# Patient Record
Sex: Female | Born: 1980
Health system: Southern US, Community
[De-identification: ages and names within clinical notes are randomized; demographics above are authoritative.]

## PROBLEM LIST (undated history)

## (undated) DIAGNOSIS — N61 Mastitis without abscess: Secondary | ICD-10-CM

## (undated) DIAGNOSIS — K5190 Ulcerative colitis, unspecified, without complications: Secondary | ICD-10-CM

## (undated) DIAGNOSIS — O039 Complete or unspecified spontaneous abortion without complication: Secondary | ICD-10-CM

## (undated) HISTORY — DX: Mastitis without abscess: N61.0

## (undated) HISTORY — DX: Complete or unspecified spontaneous abortion without complication: O03.9

## (undated) HISTORY — DX: Ulcerative colitis, unspecified, without complications: K51.90

## (undated) HISTORY — PX: NO PAST SURGERIES: SHX2092

---

## 2006-07-06 ENCOUNTER — Ambulatory Visit: Payer: Self-pay | Admitting: Obstetrics and Gynecology

## 2006-09-24 ENCOUNTER — Ambulatory Visit: Payer: Self-pay | Admitting: Obstetrics and Gynecology

## 2006-11-26 ENCOUNTER — Encounter: Payer: Self-pay | Admitting: Maternal & Fetal Medicine

## 2007-01-31 ENCOUNTER — Encounter: Payer: Self-pay | Admitting: Obstetrics and Gynecology

## 2007-02-12 ENCOUNTER — Ambulatory Visit: Payer: Self-pay | Admitting: Obstetrics and Gynecology

## 2007-03-14 ENCOUNTER — Encounter: Payer: Self-pay | Admitting: Maternal & Fetal Medicine

## 2007-04-04 ENCOUNTER — Ambulatory Visit: Payer: Self-pay | Admitting: Obstetrics and Gynecology

## 2007-05-08 ENCOUNTER — Inpatient Hospital Stay: Payer: Self-pay | Admitting: Obstetrics and Gynecology

## 2008-06-02 ENCOUNTER — Ambulatory Visit: Payer: Self-pay | Admitting: Obstetrics and Gynecology

## 2008-08-25 ENCOUNTER — Ambulatory Visit: Payer: Self-pay | Admitting: Obstetrics and Gynecology

## 2008-11-09 ENCOUNTER — Ambulatory Visit: Payer: Self-pay | Admitting: Obstetrics and Gynecology

## 2009-01-05 ENCOUNTER — Other Ambulatory Visit: Payer: Self-pay | Admitting: Obstetrics and Gynecology

## 2009-02-02 ENCOUNTER — Inpatient Hospital Stay: Payer: Self-pay | Admitting: Obstetrics and Gynecology

## 2011-05-23 ENCOUNTER — Other Ambulatory Visit: Payer: Self-pay | Admitting: Unknown Physician Specialty

## 2011-05-29 ENCOUNTER — Ambulatory Visit: Payer: Self-pay | Admitting: Unknown Physician Specialty

## 2011-05-31 LAB — PATHOLOGY REPORT

## 2012-04-18 ENCOUNTER — Ambulatory Visit: Payer: Self-pay | Admitting: Obstetrics and Gynecology

## 2012-04-18 LAB — URINALYSIS, COMPLETE
Bacteria: NONE SEEN
Bilirubin,UR: NEGATIVE
Ketone: NEGATIVE
Leukocyte Esterase: NEGATIVE
Ph: 5 (ref 4.5–8.0)
RBC,UR: NONE SEEN /HPF (ref 0–5)
Specific Gravity: 1.02 (ref 1.003–1.030)
Squamous Epithelial: 1
WBC UR: 1 /HPF (ref 0–5)

## 2012-04-18 LAB — CBC WITH DIFFERENTIAL/PLATELET
Basophil #: 0 x10 3/mm 3
Basophil %: 0.3 %
Eosinophil #: 0.3 x10 3/mm 3
Eosinophil %: 3.6 %
HCT: 36.2 %
HGB: 12.9 g/dL
Lymphocyte %: 25.6 %
Lymphs Abs: 2.2 x10 3/mm 3
MCH: 33.1 pg
MCHC: 35.6 g/dL
MCV: 93 fL
Monocyte #: 0.7 "x10 3/mm "
Monocyte %: 8.1 %
Neutrophil #: 5.2 x10 3/mm 3
Neutrophil %: 62.4 %
Platelet: 206 x10 3/mm 3
RBC: 3.89 X10 6/mm 3
RDW: 11.9 %
WBC: 8.4 x10 3/mm 3

## 2012-11-20 ENCOUNTER — Inpatient Hospital Stay: Payer: Self-pay | Admitting: Obstetrics and Gynecology

## 2012-11-20 LAB — CBC WITH DIFFERENTIAL/PLATELET
Basophil %: 0.2 %
Eosinophil #: 0.1 10*3/uL (ref 0.0–0.7)
HCT: 36.3 % (ref 35.0–47.0)
HGB: 13.1 g/dL (ref 12.0–16.0)
Lymphocyte #: 1.5 10*3/uL (ref 1.0–3.6)
Lymphocyte %: 17.6 %
MCH: 34.3 pg — ABNORMAL HIGH (ref 26.0–34.0)
MCHC: 36 g/dL (ref 32.0–36.0)
Monocyte #: 0.8 x10 3/mm (ref 0.2–0.9)
Monocyte %: 9.5 %
Neutrophil %: 71.6 %
Platelet: 158 10*3/uL (ref 150–440)
RBC: 3.81 10*6/uL (ref 3.80–5.20)
RDW: 12.5 % (ref 11.5–14.5)
WBC: 8.3 10*3/uL (ref 3.6–11.0)

## 2012-11-21 LAB — HEMATOCRIT: HCT: 36.1 % (ref 35.0–47.0)

## 2013-07-03 LAB — HM PAP SMEAR: HM Pap smear: NEGATIVE

## 2014-10-20 NOTE — H&P (Signed)
L&D Evaluation:  History:  HPI 48 yowmf G3P2002 at 40.5 weeks admitted for IOL.   Patient's Medical History Ulcerative Colitis; GBS+   Patient's Surgical History Colonoscopy   Medications Pre Natal Vitamins   Allergies NKDA   Social History none   Family History Non-Contributory   ROS:  ROS All systems were reviewed.  HEENT, CNS, GI, GU, Respiratory, CV, Renal and Musculoskeletal systems were found to be normal.   Exam:  Vital Signs stable   Urine Protein not completed   General no apparent distress   Mental Status clear   Abdomen gravid, non-tender   Estimated Fetal Weight Average for gestational age, 7#10   Back no CVAT   Edema no edema   Pelvic no external lesions, 4/90/+1/Posterior/AROM-Clear   Mebranes AROM   Description clear   FHT normal rate with no decels   Ucx irregular   Skin dry   Lymph no lymphadenopathy   Other O+/ATB-/NR/RI/VI/HIV-/HB-/GBS+   Impression:  Impression TIUP; GBS+; Asdvanced Cervical Dilation   Plan:  Plan antibiotics for GBBS prophylaxis, AROM; Pitocin Augmentation; Epidural prn   Electronic Signatures: Katalin Colledge, Alanda Slim (MD)  (Signed 11-Jun-14 15:41)  Authored: L&D Evaluation   Last Updated: 11-Jun-14 15:41 by Korvin Valentine, Alanda Slim (MD)

## 2015-06-13 NOTE — L&D Delivery Note (Signed)
Delivery Note At 10:34 PM a viable female infant was delivered via Vaginal, Spontaneous Delivery (Presentation: ROA).  APGAR: 8, 9 ; weight 3,970 grams .   Placenta status: delivered spontaneously, intact.  Cord: 3VC with the following complications: None.  Cord pH: None  Anesthesia:  Epidural Episiotomy: None Lacerations: 1st degree Suture Repair: 3.0 vicryl  (to stop a small bleeding vessel) Est. Blood Loss (mL): 350  Mom to postpartum.  Baby to Couplet care / Skin to Skin.  Called to see patient.  Mom pushed to deliver a viable female infant.  The head delivered easily.  The shoulders, however, did not follow immediately with the usual, gentle traction.  The patient was placed into [place] and with gentle traction the anterior (left) shoulder delivered follwed by the right.  The presentation was ultimately compound with the right hand/arm.  The rest of the body delivered without difficulty.  A single nuchal cord noted and reduced at the perineum.  Baby to mom's chest.  Cord clamped and cut after > 1 min delay.  Cord blood obtained.  Placenta delivered spontaneously, intact, with a 3-vessel cord.  First degree perineal laceration repaired with 3-0 Vicryl with a single figure-of-eight stitch.  This was only repaired due to an active bleeder vessel.  All counts correct.  Hemostasis obtained with IV pitocin and fundal massage. EBL 350 mL.  The left arm and hand was moving normally immediately after delivery.  Prentice Docker, MD 05/02/2016 10:58 PM

## 2015-07-13 ENCOUNTER — Ambulatory Visit (INDEPENDENT_AMBULATORY_CARE_PROVIDER_SITE_OTHER): Payer: 59 | Admitting: Obstetrics and Gynecology

## 2015-07-13 ENCOUNTER — Encounter: Payer: Self-pay | Admitting: Obstetrics and Gynecology

## 2015-07-13 VITALS — BP 103/66 | HR 73 | Ht 67.0 in | Wt 122.8 lb

## 2015-07-13 DIAGNOSIS — K5190 Ulcerative colitis, unspecified, without complications: Secondary | ICD-10-CM | POA: Insufficient documentation

## 2015-07-13 DIAGNOSIS — Z0000 Encounter for general adult medical examination without abnormal findings: Secondary | ICD-10-CM | POA: Diagnosis not present

## 2015-07-13 DIAGNOSIS — Z01419 Encounter for gynecological examination (general) (routine) without abnormal findings: Secondary | ICD-10-CM

## 2015-07-13 NOTE — Progress Notes (Signed)
Patient ID: Lindsay Owens, female   DOB: 12/18/1980, 35 y.o.   MRN: [id number] ANNUAL PREVENTATIVE CARE GYN  ENCOUNTER NOTE  Subjective:       Lindsay Owens is a 35 y.o. G46P3003 female here for a routine annual gynecologic exam.  Current complaints: 1.  none    Lindsay Owens is a 35 yo G43P3003 female who presents for a routine annual gynecologic exam. She has no significant complaints at this time. Her periods are regular, occuring every 28 days, lasting 5 days. Her flow is heavier than previously, but this is not bothersome to her. She denies dysmenorrhea. She does admit to having pelvic floor pain with running, however, this is not debilitating. She denies changes in urinary or bowel patterns.   Gynecologic History Patient's last menstrual period was 06/29/2015 (approximate). Contraception: condoms Last Pap: 07/03/2013 neg/neg. Results were: normal Last mammogram: n/a. Results were: n/a  Obstetric History OB History  Gravida Para Term Preterm AB SAB TAB Ectopic Multiple Living  3 3 3       3     # Outcome Date GA Lbr Len/2nd Weight Sex Delivery Anes PTL Lv  3 Term    8 lb 1.8 oz (3.679 kg)  Vag-Spont   Y  2 Term    7 lb 2.1 oz (3.234 kg)  Vag-Spont   Y  1 Term    8 lb 6.4 oz (3.81 kg)  Vag-Spont   Y      Past Medical History  Diagnosis Date  . Acute mastitis of left breast   . Ulcerative colitis ([place])     controlled    Past Surgical History  Procedure Laterality Date  . No past surgeries      Current Outpatient Prescriptions on File Prior to Visit  Medication Sig Dispense Refill  . Condoms - Female MISC by Does not apply route.    . Multiple Vitamins-Minerals (MULTIVITAMIN ADULT PO) Take by mouth.    . Saccharomyces boulardii (PROBIOTIC) 250 MG CAPS Take by mouth.     No current facility-administered medications on file prior to visit.    No Known Allergies  Social History   Social History  . Marital Status: Married    Spouse Name: N/A  . Number of Children: N/A  .  Years of Education: N/A   Occupational History  . Not on file.   Social History Main Topics  . Smoking status: Never Smoker   . Smokeless tobacco: Not on file  . Alcohol Use: Yes     Comment: rare  . Drug Use: No  . Sexual Activity: Yes    Birth Control/ Protection: Condom   Other Topics Concern  . Not on file   Social History Narrative    Family History  Problem Relation Age of Onset  . Breast cancer Maternal Aunt   . Heart disease Maternal Grandmother   . Colon cancer Neg Hx   . Ovarian cancer Neg Hx   . Diabetes Neg Hx     The following portions of the patient's history were reviewed and updated as appropriate: allergies, current medications, past family history, past medical history, past social history, past surgical history and problem list.  Review of Systems Review of Systems  Constitutional: Negative for fever and chills.  Gastrointestinal: Negative for diarrhea and constipation.  Genitourinary: Negative for dysuria, urgency and frequency.  All other systems reviewed and are negative.    Objective:   BP 103/66 mmHg  Pulse 73  Ht 5'  7" (1.702 m)  Wt 122 lb 12.8 oz (55.702 kg)  BMI 19.23 kg/m2  LMP 06/29/2015 (Approximate) CONSTITUTIONAL: Well-developed, well-nourished female in no acute distress.  PSYCHIATRIC: Normal mood and affect. Normal behavior. Normal judgment and thought content. [place]: Alert and oriented to person, place, and time. Normal muscle tone coordination. No cranial nerve deficit noted. HENT:  Normocephalic, atraumatic, External right and left ear normal.  NECK: Normal range of motion, supple, no masses.  Normal thyroid.  CARDIOVASCULAR: Normal heart rate noted, regular rhythm, no murmur. RESPIRATORY: Clear to auscultation bilaterally. Effort and breath sounds normal, no problems with respiration noted. BREASTS: Symmetric in size. No masses, skin changes, nipple drainage, or lymphadenopathy. ABDOMEN: Soft, normal bowel sounds, no  distention noted.  No tenderness, rebound or guarding.  BLADDER: Normal PELVIC:  External Genitalia: Normal  BUS: Normal  Vagina: Normal  Cervix: Normal; no cervical motion tenderness  Uterus: Normal; retroverted, mobile, slightly tender  Adnexa: Normal; Nonpalpable and nontender  RV: External Exam NormaI  LYMPHATIC: No Axillary, Supraclavicular, or Inguinal Adenopathy.    Assessment:   Annual gynecologic examination 35 y.o. Contraception: condoms bmi-19 Problem List Items Addressed This Visit    None      Plan:  Pap: Not needed Mammogram: Not Indicated Stool Guaiac Testing:  Not Indicated Labs: wants every other year Routine preventative health maintenance measures emphasized: Exercise/Diet/Weight control, Tobacco Warnings, Alcohol/Substance use risks, Stress Management, Peer Pressure Issues and Safe Sex  Return to [place], [place]  Loni Muse PA-S Brayton Mars, MD Incomplete Incomplete note  I have seen, interviewed, and examined the patient in conjunction with the [place].A. student and affirm the diagnosis and management plan. Asjia Berrios A. Ally Knodel, MD, FACOG   Note: This dictation was prepared with Dragon dictation along with smaller phrase technology. Any transcriptional errors that result from this process are unintentional.  '

## 2015-09-02 ENCOUNTER — Ambulatory Visit (INDEPENDENT_AMBULATORY_CARE_PROVIDER_SITE_OTHER): Payer: 59 | Admitting: Obstetrics and Gynecology

## 2015-09-02 ENCOUNTER — Encounter: Payer: Self-pay | Admitting: Obstetrics and Gynecology

## 2015-09-02 VITALS — BP 97/54 | HR 47 | Ht 67.0 in | Wt 120.4 lb

## 2015-09-02 DIAGNOSIS — Z331 Pregnant state, incidental: Secondary | ICD-10-CM | POA: Diagnosis not present

## 2015-09-02 DIAGNOSIS — N912 Amenorrhea, unspecified: Secondary | ICD-10-CM

## 2015-09-02 DIAGNOSIS — K5190 Ulcerative colitis, unspecified, without complications: Secondary | ICD-10-CM

## 2015-09-02 DIAGNOSIS — O09529 Supervision of elderly multigravida, unspecified trimester: Secondary | ICD-10-CM | POA: Insufficient documentation

## 2015-09-02 DIAGNOSIS — Z3490 Encounter for supervision of normal pregnancy, unspecified, unspecified trimester: Secondary | ICD-10-CM

## 2015-09-02 DIAGNOSIS — O09523 Supervision of elderly multigravida, third trimester: Secondary | ICD-10-CM | POA: Insufficient documentation

## 2015-09-02 LAB — POCT URINE PREGNANCY: PREG TEST UR: POSITIVE — AB

## 2015-09-02 NOTE — Patient Instructions (Addendum)
1. UPT-positive 2.New OB counseling: The patient has been given an overview regarding routine prenatal care. Recommendations regarding diet, weight gain, and exercise in pregnancy were given. Prenatal testing, optional genetic testing, and ultrasound use in pregnancy were reviewed.  Benefits of Breast Feeding were discussed. The patient is encouraged to consider nursing her baby post partum. 3. Continue prenatal vitamins 4. Patient is to contact us regarding nausea vomiting if she desires Diclegis

## 2015-09-02 NOTE — Progress Notes (Signed)
GYN ENCOUNTER NOTE  Subjective:       Lindsay Owens is a 35 y.o. [phone] female is here for gynecologic evaluation of the following issues:  1. Confirmation of pregnancy.     Prenatal risk factors: 1. Ulcerative colitis, stable 2. Advanced maternal age; not interested in genetic screening   Gynecologic History Patient's last menstrual period was 07/20/2015 (exact date). Contraception: none Last Pap: Normal Last mammogram: NA  Obstetric History OB History  Gravida Para Term Preterm AB SAB TAB Ectopic Multiple Living  4 3 3       3     # Outcome Date GA Lbr Len/2nd Weight Sex Delivery Anes PTL Lv  4 Current           3 Term    8 lb 1.8 oz (3.679 kg)  Vag-Spont   Y  2 Term    7 lb 2.1 oz (3.234 kg)  Vag-Spont   Y  1 Term    8 lb 6.4 oz (3.81 kg)  Vag-Spont   Y      Past Medical History  Diagnosis Date  . Acute mastitis of left breast   . Ulcerative colitis ([place])     controlled    Past Surgical History  Procedure Laterality Date  . No past surgeries      Current Outpatient Prescriptions on File Prior to Visit  Medication Sig Dispense Refill  . Saccharomyces boulardii (PROBIOTIC) 250 MG CAPS Take by mouth.     No current facility-administered medications on file prior to visit.    No Known Allergies  Social History   Social History  . Marital Status: Married    Spouse Name: N/A  . Number of Children: N/A  . Years of Education: N/A   Occupational History  . Not on file.   Social History Main Topics  . Smoking status: Never Smoker   . Smokeless tobacco: Not on file  . Alcohol Use: Yes     Comment: rare  . Drug Use: No  . Sexual Activity: Yes    Birth Control/ Protection: None   Other Topics Concern  . Not on file   Social History Narrative    Family History  Problem Relation Age of Onset  . Breast cancer Maternal Aunt   . Heart disease Maternal Grandmother   . Colon cancer Neg Hx   . Ovarian cancer Neg Hx   . Diabetes Neg Hx     The  following portions of the patient's history were reviewed and updated as appropriate: allergies, current medications, past family history, past medical history, past social history, past surgical history and problem list.  Review of Systems POSITIVE: Mild nausea; mild breast tenderness Review of Systems - General ROS: negative for - chills, fatigue, fever, hot flashes, malaise or night sweats Hematological and Lymphatic ROS: negative for - bleeding problems or swollen lymph nodes Gastrointestinal ROS: negative for - abdominal pain, blood in stools, change in bowel habits and nausea/vomiting Musculoskeletal ROS: negative for - joint pain, muscle pain or muscular weakness Genito-Urinary ROS: negative for - change in menstrual cycle, dysmenorrhea, dyspareunia, dysuria, genital discharge, genital ulcers, hematuria, incontinence, irregular/heavy menses, nocturia or pelvic pain  Objective:   BP 97/54 mmHg  Pulse 47  Ht 5' 7"  (1.702 m)  Wt 120 lb 6.4 oz (54.613 kg)  BMI 18.85 kg/m2  LMP 07/20/2015 (Exact Date) CONSTITUTIONAL: Well-developed, well-nourished female in no acute distress.  Physical exam-deferred   Assessment:   1. Amenorrhea - POCT urine  pregnancy  2. Ulcerative colitis; stable; minimal problems during pregnancy in the past 3. Advanced maternal age 33. Pregnancy; regular cycles  LMP 07/20/2015  EDD 04/25/2016  (working EGD)  EGA 6.2 weeks     Plan:   1. UPT-positive 2.New OB counseling: The patient has been given an overview regarding routine prenatal care. Recommendations regarding diet, weight gain, and exercise in pregnancy were given. Prenatal testing, optional genetic testing, and ultrasound use in pregnancy were reviewed.  Benefits of Breast Feeding were discussed. The patient is encouraged to consider nursing her baby post partum. 3. Continue prenatal vitamins 4. Patient is to contact us regarding nausea vomiting if she desires Diclegis  Brayton Mars,  MD  A total of 15 minutes were spent face-to-face with the patient during this encounter and over half of that time dealt with counseling and coordination of care.  Note: This dictation was prepared with Dragon dictation along with smaller phrase technology. Any transcriptional errors that result from this process are unintentional.

## 2015-09-30 ENCOUNTER — Ambulatory Visit (INDEPENDENT_AMBULATORY_CARE_PROVIDER_SITE_OTHER): Payer: 59 | Admitting: Obstetrics and Gynecology

## 2015-09-30 VITALS — BP 95/53 | HR 73 | Wt 123.2 lb

## 2015-09-30 DIAGNOSIS — Z36 Encounter for antenatal screening of mother: Secondary | ICD-10-CM

## 2015-09-30 DIAGNOSIS — Z369 Encounter for antenatal screening, unspecified: Secondary | ICD-10-CM

## 2015-09-30 DIAGNOSIS — Z1389 Encounter for screening for other disorder: Secondary | ICD-10-CM

## 2015-09-30 DIAGNOSIS — Z331 Pregnant state, incidental: Secondary | ICD-10-CM

## 2015-09-30 DIAGNOSIS — Z113 Encounter for screening for infections with a predominantly sexual mode of transmission: Secondary | ICD-10-CM | POA: Diagnosis not present

## 2015-09-30 DIAGNOSIS — Z3490 Encounter for supervision of normal pregnancy, unspecified, unspecified trimester: Secondary | ICD-10-CM

## 2015-09-30 NOTE — Progress Notes (Signed)
Lindsay Owens presents for NOB nurse interview visit. G-4.  P-3003. Confirmation of pregnancy by Dr. Enzo Bi on 09/02/2015. Pregnancy education material explained and given. No cats in the home. NOB labs ordered.  HIV labs and Drug screen were explained optional and she could opt out of tests and declined drug screen. PNV encouraged. NT declined. Pt. To follow up with provider on 10/14/15  for NOB physical.  All questions answered.    ZIKA EXPOSURE SCREEN:  The patient has not traveled to a [place] endemic area within the past 6 months, nor has she had unprotected sex with a partner who has travelled to a [place] endemic region within the past 6 months. The patient has been advised to notify us if these factors change any time during this current pregnancy, so adequate testing and monitoring can be initiated.

## 2015-09-30 NOTE — Patient Instructions (Signed)
Pregnancy and Zika Virus Disease Zika virus disease, or Zika, is an illness that can spread to people from mosquitoes that carry the virus. It may also spread from person to person through infected body fluids. Zika first occurred in [place], but recently it has spread to new areas. The virus occurs in tropical climates. The location of Zika continues to change. Most people who become infected with Zika virus do not develop serious illness. However, Zika may cause birth defects in an unborn baby whose mother is infected with the virus. It may also increase the risk of miscarriage. WHAT ARE THE SYMPTOMS OF ZIKA VIRUS DISEASE? In many cases, people who have been infected with Zika virus do not develop any symptoms. If symptoms appear, they usually start about a week after the person is infected. Symptoms are usually mild. They may include:  Fever.  Rash.  Red eyes.  Joint pain. HOW DOES ZIKA VIRUS DISEASE SPREAD? The main way that Zika virus spreads is through the bite of a certain type of mosquito. Unlike most types of mosquitos, which bite only at night, the type of mosquito that carries Zika virus bites both at night and during the day. Zika virus can also spread through sexual contact, through a blood transfusion, and from a mother to her baby before or during birth. Once you have had Zika virus disease, it is unlikely that you will get it again. CAN I PASS ZIKA TO MY BABY DURING PREGNANCY? Yes, Zika can pass from a mother to her baby before or during birth. WHAT PROBLEMS CAN ZIKA CAUSE FOR MY BABY? A woman who is infected with Zika virus while pregnant is at risk of having her baby born with a condition in which the brain or head is smaller than expected (microcephaly). Babies who have microcephaly can have developmental delays, seizures, hearing problems, and vision problems. Having Zika virus disease during pregnancy can also increase the risk of miscarriage. HOW CAN ZIKA VIRUS DISEASE BE  PREVENTED? There is no vaccine to prevent Zika. The best way to prevent the disease is to avoid infected mosquitoes and avoid exposure to body fluids that can spread the virus. Avoid any possible exposure to Zika by taking the following precautions. For women and their sex partners:  Avoid traveling to high-risk areas. The locations where Zika is being reported change often. To identify high-risk areas, check the CDC travel website: www.cdc.gov/zika/geo/index.html  If you or your sex partner must travel to a high-risk area, talk with a health care provider before and after traveling.  Take all precautions to avoid mosquito bites if you live in, or travel to, any of the high-risk areas. Insect repellents are safe to use during pregnancy.  Ask your health care provider when it is safe to have sexual contact. For women:  If you are pregnant or trying to become pregnant, avoid sexual contact with persons who may have been exposed to Zika virus, persons who have possible symptoms of Zika, or persons whose history you are unsure about. If you choose to have sexual contact with someone who may have been exposed to Zika virus, use condoms correctly during the entire duration of sexual activity, every time. Do not share sexual devices, as you may be exposed to body fluids.  Ask your health care provider about when it is safe to attempt pregnancy after a possible exposure to Zika virus. WHAT STEPS SHOULD I TAKE TO AVOID MOSQUITO BITES? Take these steps to avoid mosquito bites when you are   in a high-risk area:  Wear loose clothing that covers your arms and legs.  Limit your outdoor activities.  Do not open windows unless they have window screens.  Sleep under mosquito nets.  Use insect repellent. The best insect repellents have:  DEET, picaridin, oil of lemon eucalyptus (OLE), or [id number] in them.  Higher amounts of an active ingredient in them.  Remember that insect repellents are safe to use  during pregnancy.  Do not use OLE on children who are younger than 3 years of age. Do not use insect repellent on babies who are younger than 2 months of age.  Cover your child's stroller with mosquito netting. Make sure the netting fits snugly and that any loose netting does not cover your child's mouth or nose. Do not use a blanket as a mosquito-protection cover.  Do not apply insect repellent underneath clothing.  If you are using sunscreen, apply the sunscreen before applying the insect repellent.  Treat clothing with permethrin. Do not apply permethrin directly to your skin. Follow label directions for safe use.  Get rid of standing water, where mosquitoes may reproduce. Standing water is often found in items such as buckets, bowls, animal food dishes, and flowerpots. When you return from traveling to any high-risk area, continue taking actions to protect yourself against mosquito bites for 3 weeks, even if you show no signs of illness. This will prevent spreading Zika virus to uninfected mosquitoes. WHAT SHOULD I KNOW ABOUT THE SEXUAL TRANSMISSION OF ZIKA? People can spread Zika to their sexual partners during vaginal, anal, or oral sex, or by sharing sexual devices. Many people with Zika do not develop symptoms, so a person could spread the disease without knowing that they are infected. The greatest risk is to women who are pregnant or who may become pregnant. Zika virus can live longer in semen than it can live in blood. Couples can prevent sexual transmission of the virus by:  Using condoms correctly during the entire duration of sexual activity, every time. This includes vaginal, anal, and oral sex.  Not sharing sexual devices. Sharing increases your risk of being exposed to body fluid from another person.  Avoiding all sexual activity until your health care provider says it is safe. SHOULD I BE TESTED FOR ZIKA VIRUS? A sample of your blood can be tested for Zika virus. A pregnant  woman should be tested if she may have been exposed to the virus or if she has symptoms of Zika. She may also have additional tests done during her pregnancy, such ultrasound testing. Talk with your health care provider about which tests are recommended.   This information is not intended to replace advice given to you by your health care provider. Make sure you discuss any questions you have with your health care provider.   Document Released: 02/17/2015 Document Reviewed: 02/10/2015 Elsevier Interactive Patient Education 2016 [employer] Minor Illnesses and Medications in Pregnancy  Cold/Flu:  Sudafed for congestion- Robitussin (plain) for cough- Tylenol for discomfort.  Please follow the directions on the label.  Try not to take any more than needed.  OTC Saline nasal spray and air humidifier or cool-mist  Vaporizer to sooth nasal irritation and to loosen congestion.  It is also important to increase intake of non carbonated fluids, especially if you have a fever.  Constipation:  Colace-2 capsules at bedtime; Metamucil- follow directions on label; Senokot- 1 tablet at bedtime.  Any one of these medications can be used.  It is also   very important to increase fluids and fruits along with regular exercise.  If problem persists please call the office.  Diarrhea:  Kaopectate as directed on the label.  Eat a bland diet and increase fluids.  Avoid highly seasoned foods.  Headache:  Tylenol 1 or 2 tablets every 3-4 hours as needed  Indigestion:  Maalox, Mylanta, Tums or Rolaids- as directed on label.  Also try to eat small meals and avoid fatty, greasy or spicy foods.  Nausea with or without Vomiting:  Nausea in pregnancy is caused by increased levels of hormones in the body which influence the digestive system and cause irritation when stomach acids accumulate.  Symptoms usually subside after 1st trimester of pregnancy.  Try the following:  Keep saltines, graham crackers or dry toast by your bed  to eat upon awakening.  Don't let your stomach get empty.  Try to eat 5-6 small meals per day instead of 3 large ones.  Avoid greasy fatty or highly seasoned foods.   Take OTC Unisom 1 tablet at bed time along with OTC Vitamin B6 25-50 mg 3 times per day.    If nausea continues with vomiting and you are unable to keep down food and fluids you may need a prescription medication.  Please notify your provider.   Sore throat:  Chloraseptic spray, throat lozenges and or plain Tylenol.  Vaginal Yeast Infection:  OTC Monistat for 7 days as directed on label.  If symptoms do not resolve within a week notify provider.  If any of the above problems do not subside with recommended treatment please call the office for further assistance.   Do not take Aspirin, Advil, Motrin or Ibuprofen.  * * OTC= Over the counter Hyperemesis Gravidarum Hyperemesis gravidarum is a severe form of nausea and vomiting that happens during pregnancy. Hyperemesis is worse than morning sickness. It may cause you to have nausea or vomiting all day for many days. It may keep you from eating and drinking enough food and liquids. Hyperemesis usually occurs during the first half (the first 20 weeks) of pregnancy. It often goes away once a woman is in her second half of pregnancy. However, sometimes hyperemesis continues through an entire pregnancy.  CAUSES  The cause of this condition is not completely known but is thought to be related to changes in the body's hormones when pregnant. It could be from the high level of the pregnancy hormone or an increase in estrogen in the body.  SIGNS AND SYMPTOMS   Severe nausea and vomiting.  Nausea that does not go away.  Vomiting that does not allow you to keep any food down.  Weight loss and body fluid loss (dehydration).  Having no desire to eat or not liking food you have previously enjoyed. DIAGNOSIS  Your health care provider will do a physical exam and ask you about your symptoms.  He or she may also order blood tests and urine tests to make sure something else is not causing the problem.  TREATMENT  You may only need medicine to control the problem. If medicines do not control the nausea and vomiting, you will be treated in the hospital to prevent dehydration, increased acid in the blood (acidosis), weight loss, and changes in the electrolytes in your body that may harm the unborn baby (fetus). You may need IV fluids.  HOME CARE INSTRUCTIONS   Only take over-the-counter or prescription medicines as directed by your health care provider.  Try eating a couple of dry crackers or   toast in the morning before getting out of bed.  Avoid foods and smells that upset your stomach.  Avoid fatty and spicy foods.  Eat 5-6 small meals a day.  Do not drink when eating meals. Drink between meals.  For snacks, eat high-protein foods, such as cheese.  Eat or suck on things that have ginger in them. Ginger helps nausea.  Avoid food preparation. The smell of food can spoil your appetite.  Avoid iron pills and iron in your multivitamins until after 3-4 months of being pregnant. However, consult with your health care provider before stopping any prescribed iron pills. SEEK MEDICAL CARE IF:   Your abdominal pain increases.  You have a severe headache.  You have vision problems.  You are losing weight. SEEK IMMEDIATE MEDICAL CARE IF:   You are unable to keep fluids down.  You vomit blood.  You have constant nausea and vomiting.  You have excessive weakness.  You have extreme thirst.  You have dizziness or fainting.  You have a fever or persistent symptoms for more than 2-3 days.  You have a fever and your symptoms suddenly get worse. MAKE SURE YOU:   Understand these instructions.  Will watch your condition.  Will get help right away if you are not doing well or get worse.   This information is not intended to replace advice given to you by your health care  provider. Make sure you discuss any questions you have with your health care provider.   Document Released: 05/29/2005 Document Revised: 03/19/2013 Document Reviewed: 01/08/2013 Elsevier Interactive Patient Education 2016 Elsevier Inc. Commonly Asked Questions During Pregnancy  Cats: A parasite can be excreted in cat feces.  To avoid exposure you need to have another person empty the little box.  If you must empty the litter box you will need to wear gloves.  Wash your hands after handling your cat.  This parasite can also be found in raw or undercooked meat so this should also be avoided.  Colds, Sore Throats, Flu: Please check your medication sheet to see what you can take for symptoms.  If your symptoms are unrelieved by these medications please call the office.  Dental Work: Most any dental work your dentist recommends is permitted.  X-rays should only be taken during the first trimester if absolutely necessary.  Your abdomen should be shielded with a lead apron during all x-rays.  Please notify your provider prior to receiving any x-rays.  Novocaine is fine; gas is not recommended.  If your dentist requires a note from us prior to dental work please call the office and we will provide one for you.  Exercise: Exercise is an important part of staying healthy during your pregnancy.  You may continue most exercises you were accustomed to prior to pregnancy.  Later in your pregnancy you will most likely notice you have difficulty with activities requiring balance like riding a bicycle.  It is important that you listen to your body and avoid activities that put you at a higher risk of falling.  Adequate rest and staying well hydrated are a must!  If you have questions about the safety of specific activities ask your provider.    Exposure to Children with illness: Try to avoid obvious exposure; report any symptoms to us when noted,  If you have chicken pos, red measles or mumps, you should be immune to  these diseases.   Please do not take any vaccines while pregnant unless you have checked with   your OB provider.  Fetal Movement: After 28 weeks we recommend you do "kick counts" twice daily.  Lie or sit down in a calm quiet environment and count your baby movements "kicks".  You should feel your baby at least 10 times per hour.  If you have not felt 10 kicks within the first hour get up, walk around and have something sweet to eat or drink then repeat for an additional hour.  If count remains less than 10 per hour notify your provider.  Fumigating: Follow your pest control agent's advice as to how long to stay out of your home.  Ventilate the area well before re-entering.  Hemorrhoids:   Most over-the-counter preparations can be used during pregnancy.  Check your medication to see what is safe to use.  It is important to use a stool softener or fiber in your diet and to drink lots of liquids.  If hemorrhoids seem to be getting worse please call the office.   Hot Tubs:  Hot tubs Jacuzzis and saunas are not recommended while pregnant.  These increase your internal body temperature and should be avoided.  Intercourse:  Sexual intercourse is safe during pregnancy as long as you are comfortable, unless otherwise advised by your provider.  Spotting may occur after intercourse; report any bright red bleeding that is heavier than spotting.  Labor:  If you know that you are in labor, please go to the hospital.  If you are unsure, please call the office and let us help you decide what to do.  Lifting, straining, etc:  If your job requires heavy lifting or straining please check with your provider for any limitations.  Generally, you should not lift items heavier than that you can lift simply with your hands and arms (no back muscles)  Painting:  Paint fumes do not harm your pregnancy, but may make you ill and should be avoided if possible.  Latex or water based paints have less odor than oils.  Use adequate  ventilation while painting.  Permanents & Hair Color:  Chemicals in hair dyes are not recommended as they cause increase hair dryness which can increase hair loss during pregnancy.  " Highlighting" and permanents are allowed.  Dye may be absorbed differently and permanents may not hold as well during pregnancy.  Sunbathing:  Use a sunscreen, as skin burns easily during pregnancy.  Drink plenty of fluids; avoid over heating.  Tanning Beds:  Because their possible side effects are still unknown, tanning beds are not recommended.  Ultrasound Scans:  Routine ultrasounds are performed at approximately 20 weeks.  You will be able to see your baby's general anatomy an if you would like to know the gender this can usually be determined as well.  If it is questionable when you conceived you may also receive an ultrasound early in your pregnancy for dating purposes.  Otherwise ultrasound exams are not routinely performed unless there is a medical necessity.  Although you can request a scan we ask that you pay for it when conducted because insurance does not cover " patient request" scans.  Work: If your pregnancy proceeds without complications you may work until your due date, unless your physician or employer advises otherwise.  Round Ligament Pain/Pelvic Discomfort:  Sharp, shooting pains not associated with bleeding are fairly common, usually occurring in the second trimester of pregnancy.  They tend to be worse when standing up or when you remain standing for long periods of time.  These are the result   of pressure of certain pelvic ligaments called "round ligaments".  Rest, Tylenol and heat seem to be the most effective relief.  As the womb and fetus grow, they rise out of the pelvis and the discomfort improves.  Please notify the office if your pain seems different than that described.  It may represent a more serious condition.   

## 2015-10-01 LAB — CBC WITH DIFFERENTIAL/PLATELET
BASOS ABS: 0 10*3/uL (ref 0.0–0.2)
Basos: 0 %
EOS (ABSOLUTE): 0.4 10*3/uL (ref 0.0–0.4)
Eos: 6 %
HEMOGLOBIN: 14 g/dL (ref 11.1–15.9)
Hematocrit: 41.4 % (ref 34.0–46.6)
IMMATURE GRANS (ABS): 0 10*3/uL (ref 0.0–0.1)
Immature Granulocytes: 0 %
LYMPHS ABS: 1.7 10*3/uL (ref 0.7–3.1)
LYMPHS: 21 %
MCH: 31.5 pg (ref 26.6–33.0)
MCHC: 33.8 g/dL (ref 31.5–35.7)
MCV: 93 fL (ref 79–97)
MONOCYTES: 7 %
Monocytes Absolute: 0.5 10*3/uL (ref 0.1–0.9)
Neutrophils Absolute: 5.2 10*3/uL (ref 1.4–7.0)
Neutrophils: 66 %
Platelets: 230 10*3/uL (ref 150–379)
RBC: 4.45 x10E6/uL (ref 3.77–5.28)
RDW: 12.7 % (ref 12.3–15.4)
WBC: 7.8 10*3/uL (ref 3.4–10.8)

## 2015-10-01 LAB — URINALYSIS, ROUTINE W REFLEX MICROSCOPIC
BILIRUBIN UA: NEGATIVE
Glucose, UA: NEGATIVE
KETONES UA: NEGATIVE
Leukocytes, UA: NEGATIVE
Nitrite, UA: NEGATIVE
PH UA: 6 (ref 5.0–7.5)
PROTEIN UA: NEGATIVE
RBC UA: NEGATIVE
SPEC GRAV UA: 1.024 (ref 1.005–1.030)
UUROB: 0.2 mg/dL (ref 0.2–1.0)

## 2015-10-01 LAB — RPR: RPR Ser Ql: NONREACTIVE

## 2015-10-01 LAB — VARICELLA ZOSTER ANTIBODY, IGM

## 2015-10-01 LAB — RH TYPE: Rh Factor: POSITIVE

## 2015-10-01 LAB — ANTIBODY SCREEN: ANTIBODY SCREEN: NEGATIVE

## 2015-10-01 LAB — RUBELLA ANTIBODY, IGM: Rubella IgM: <20 AU/mL (ref 0.0–19.9)

## 2015-10-01 LAB — ABO

## 2015-10-01 LAB — HEPATITIS B SURFACE ANTIGEN: HEP B S AG: NEGATIVE

## 2015-10-01 LAB — HIV ANTIBODY (ROUTINE TESTING W REFLEX): HIV Screen 4th Generation wRfx: NONREACTIVE

## 2015-10-02 LAB — GC/CHLAMYDIA PROBE AMP
CHLAMYDIA, DNA PROBE: NEGATIVE
NEISSERIA GONORRHOEAE BY PCR: NEGATIVE

## 2015-10-04 LAB — CULTURE, OB URINE

## 2015-10-04 LAB — URINE CULTURE, OB REFLEX

## 2015-10-14 ENCOUNTER — Encounter: Payer: Self-pay | Admitting: Obstetrics and Gynecology

## 2015-10-14 ENCOUNTER — Ambulatory Visit (INDEPENDENT_AMBULATORY_CARE_PROVIDER_SITE_OTHER): Payer: 59 | Admitting: Obstetrics and Gynecology

## 2015-10-14 VITALS — BP 96/59 | HR 79 | Wt 123.0 lb

## 2015-10-14 DIAGNOSIS — K5190 Ulcerative colitis, unspecified, without complications: Secondary | ICD-10-CM

## 2015-10-14 DIAGNOSIS — O09521 Supervision of elderly multigravida, first trimester: Secondary | ICD-10-CM

## 2015-10-14 LAB — POCT URINALYSIS DIPSTICK
BILIRUBIN UA: NEGATIVE
GLUCOSE UA: NEGATIVE
KETONES UA: NEGATIVE
NITRITE UA: NEGATIVE
PH UA: 7.5
Protein, UA: NEGATIVE
SPEC GRAV UA: 1.015
Urobilinogen, UA: NEGATIVE

## 2015-10-14 NOTE — Progress Notes (Signed)
OBSTETRIC INITIAL PRENATAL VISIT  Subjective:    Lindsay Owens is being seen today for her first obstetrical visit.  This is a planned pregnancy. She is a [id number] female at 9w2dgestation, Estimated Date of Delivery: 04/25/16 with patient's last menstrual period of 07/20/2015 (exact date).  Her obstetrical history is significant for advanced maternal age. Relationship with FOB: spouse, living together. Patient does intend to breast feed. Pregnancy history fully reviewed.   Obstetric History   G4   P3   T3   P0   A0   TAB0   SAB0   E0   M0   L3     # Outcome Date GA Lbr Len/2nd Weight Sex Delivery Anes PTL Lv  4 Current           3 Term    8 lb 1.8 oz (3.679 kg)  Vag-Spont   Y  2 Term    7 lb 2.1 oz (3.234 kg)  Vag-Spont   Y  1 Term    8 lb 6.4 oz (3.81 kg)  Vag-Spont   Y      Gynecologic History:  Last pap smear was ~ 3 years ago.  Results were normal.  Denies h/o abnormal pap smers in the past.  Denieshistory of STIs.    Past Medical History  Diagnosis Date  . Acute mastitis of left breast   . [place]ative [place]     controlled    Family History  Problem Relation Age of Onset  . Breast cancer Maternal Aunt   . Heart disease Maternal Grandmother   . Colon cancer Neg Hx   . Ovarian cancer Neg Hx   . Diabetes Neg Hx     Past Surgical History  Procedure Laterality Date  . No past surgeries      Social History   Social History  . Marital Status: Married    Spouse Name: N/A  . Number of Children: N/A  . Years of Education: N/A   Occupational History  . Not on file.   Social History Main Topics  . Smoking status: Never Smoker   . Smokeless tobacco: Never Used  . Alcohol Use: Yes     Comment: rare  . Drug Use: No  . Sexual Activity: Yes    Birth Control/ Protection: None   Other Topics Concern  . Not on file   Social History Narrative    Current Outpatient Prescriptions on File Prior to Visit  Medication Sig Dispense Refill  . Prenatal Vit-Fe  Fumarate-FA (PRENATAL MULTIVITAMIN) TABS tablet Take 1 tablet by mouth daily at 12 noon.    . Saccharomyces boulardii (PROBIOTIC) 250 MG CAPS Take by mouth.     No current facility-administered medications on file prior to visit.    No Known Allergies    Review of Systems General:Not Present- Fever, Weight Loss and Weight Gain. Skin:Not Present- Rash. HEENT:Not Present- Blurred Vision, Headache and Bleeding Gums. Respiratory:Not Present- Difficulty Breathing. Breast:Not Present- Breast Mass. Cardiovascular:Not Present- Chest Pain, Elevated Blood Pressure, Fainting / Blacking Out and Shortness of Breath. Gastrointestinal:Present - Nausea (improving). Not Present- Abdominal Pain, Constipation, Vomiting. Female Genitourinary:Not Present- Frequency, Painful Urination, Pelvic Pain, Vaginal Bleeding, Vaginal Discharge, Contractions, regular, Fetal Movements Decreased, Urinary Complaints and Vaginal Fluid. Musculoskeletal:Not Present- Back Pain and Leg Cramps. Neurological:Not Present- Dizziness. Psychiatric:Not Present- Depression.     Objective:   Blood pressure 96/59, pulse 79, weight 123 lb (55.792 kg), last menstrual period 07/20/2015.  Body mass index is  19.26 kg/(m^2).  General Appearance:    Alert, cooperative, no distress, appears stated age  Head:    Normocephalic, without obvious abnormality, atraumatic  Eyes:    PERRL, conjunctiva/corneas clear, EOM's intact, both eyes  Ears:    Normal external ear canals, both ears  Nose:   Nares normal, septum midline, mucosa normal, no drainage or sinus tenderness  Throat:   Lips, mucosa, and tongue normal; teeth and gums normal  Neck:   Supple, symmetrical, trachea midline, no adenopathy; thyroid: no enlargement/tenderness/nodules; no carotid bruit or JVD  Back:     Symmetric, no curvature, ROM normal, no CVA tenderness  Lungs:     Clear to auscultation bilaterally, respirations unlabored  Chest Wall:    No tenderness or  deformity   Heart:    Regular rate and rhythm, S1 and S2 normal, no murmur, rub or gallop  Breast Exam:    No tenderness, masses, or nipple abnormality  Abdomen:     Soft, non-tender, bowel sounds active all four quadrants, no masses, no organomegaly.  FH 12.  FHT 165 bpm.  Genitalia:    Pelvic:external genitalia normal, vagina without lesions, discharge, or tenderness, rectovaginal septum  normal. Cervix normal in appearance, no cervical motion tenderness, no adnexal masses or tenderness.  Pregnancy positive findings: uterine enlargement: 12 wk size, nontender.   Rectal:    Normal external sphincter.  No hemorrhoids appreciated. Internal exam not done.   Extremities:   Extremities normal, atraumatic, no cyanosis or edema  Pulses:   2+ and symmetric all extremities  Skin:   Skin color, texture, turgor normal, no rashes or lesions  Lymph nodes:   Cervical, supraclavicular, and axillary nodes normal  Neurologic:   CNII-XII intact, normal strength, sensation and reflexes throughout     Assessment:   Pregnancy at 12 and 2/7 weeks   Advanced maternal age [place] and Varicella non-immune H/o ulcerative colitis, controlled.    Plan:    Initial labs reviewed. Prenatal vitamins encouraged. Problem list reviewed and updated. Pap smear performed today.  Will need Varicella and rubella vaccines postpartum.  New OB counseling:  The patient has been given an overview regarding routine prenatal care.  Recommendations regarding diet, weight gain, and exercise in pregnancy were given. Prenatal testing, optional genetic testing, and ultrasound use in pregnancy were reviewed.  AFP3 discussed: declined.  Declines all genetic testing.  Benefits of Breast Feeding were discussed. The patient is encouraged to consider nursing her baby post partum. Follow up in 4 weeks.  50% of 30 min visit spent on counseling and coordination of care.    Rubie Maid, MD [facility]

## 2015-11-16 ENCOUNTER — Encounter: Payer: Self-pay | Admitting: Obstetrics and Gynecology

## 2015-11-16 ENCOUNTER — Ambulatory Visit (INDEPENDENT_AMBULATORY_CARE_PROVIDER_SITE_OTHER): Payer: 59 | Admitting: Obstetrics and Gynecology

## 2015-11-16 VITALS — BP 98/64 | HR 66 | Wt 129.1 lb

## 2015-11-16 DIAGNOSIS — Z3492 Encounter for supervision of normal pregnancy, unspecified, second trimester: Secondary | ICD-10-CM

## 2015-11-16 DIAGNOSIS — Z3482 Encounter for supervision of other normal pregnancy, second trimester: Secondary | ICD-10-CM

## 2015-11-16 LAB — POCT URINALYSIS DIPSTICK
Bilirubin, UA: NEGATIVE
Blood, UA: NEGATIVE
Glucose, UA: NEGATIVE
KETONES UA: NEGATIVE
LEUKOCYTES UA: NEGATIVE
NITRITE UA: NEGATIVE
PROTEIN UA: NEGATIVE
Spec Grav, UA: 1.02
Urobilinogen, UA: NEGATIVE
pH, UA: 6.5

## 2015-11-16 NOTE — Progress Notes (Signed)
ROB: Patient doing well, no complaints.  RTC in 4 weeks, for anatomy scan in 3 weeks.

## 2015-12-07 ENCOUNTER — Ambulatory Visit (INDEPENDENT_AMBULATORY_CARE_PROVIDER_SITE_OTHER): Payer: 59

## 2015-12-07 DIAGNOSIS — Z3492 Encounter for supervision of normal pregnancy, unspecified, second trimester: Secondary | ICD-10-CM

## 2015-12-07 DIAGNOSIS — Z3482 Encounter for supervision of other normal pregnancy, second trimester: Secondary | ICD-10-CM

## 2015-12-15 ENCOUNTER — Encounter: Payer: Self-pay | Admitting: Obstetrics and Gynecology

## 2015-12-15 ENCOUNTER — Ambulatory Visit (INDEPENDENT_AMBULATORY_CARE_PROVIDER_SITE_OTHER): Payer: 59 | Admitting: Obstetrics and Gynecology

## 2015-12-15 VITALS — BP 97/65 | HR 56 | Wt 133.1 lb

## 2015-12-15 DIAGNOSIS — Z1389 Encounter for screening for other disorder: Secondary | ICD-10-CM

## 2015-12-15 DIAGNOSIS — Z36 Encounter for antenatal screening of mother: Secondary | ICD-10-CM

## 2015-12-15 DIAGNOSIS — O09522 Supervision of elderly multigravida, second trimester: Secondary | ICD-10-CM

## 2015-12-15 DIAGNOSIS — Z3482 Encounter for supervision of other normal pregnancy, second trimester: Secondary | ICD-10-CM

## 2015-12-15 DIAGNOSIS — Z369 Encounter for antenatal screening, unspecified: Secondary | ICD-10-CM

## 2015-12-15 LAB — POCT URINALYSIS DIPSTICK
BILIRUBIN UA: NEGATIVE
Blood, UA: NEGATIVE
GLUCOSE UA: NEGATIVE
KETONES UA: NEGATIVE
Leukocytes, UA: NEGATIVE
Nitrite, UA: NEGATIVE
Protein, UA: NEGATIVE
SPEC GRAV UA: 1.02
UROBILINOGEN UA: NEGATIVE
pH, UA: 6

## 2015-12-15 MED ORDER — GLYBURIDE 2.5 MG PO TABS: 2.5000 mg | ORAL_TABLET | Freq: Every day | ORAL | Status: DC

## 2015-12-15 NOTE — Progress Notes (Signed)
ROB: Doing well, no complaints. S/p normal anatomy scan.  RTC in 4 weeks.

## 2015-12-18 DIAGNOSIS — Z3482 Encounter for supervision of other normal pregnancy, second trimester: Secondary | ICD-10-CM | POA: Insufficient documentation

## 2016-01-12 ENCOUNTER — Ambulatory Visit (INDEPENDENT_AMBULATORY_CARE_PROVIDER_SITE_OTHER): Payer: 59 | Admitting: Obstetrics and Gynecology

## 2016-01-12 ENCOUNTER — Encounter: Payer: Self-pay | Admitting: Obstetrics and Gynecology

## 2016-01-12 VITALS — BP 87/51 | HR 63 | Wt 139.5 lb

## 2016-01-12 DIAGNOSIS — Z131 Encounter for screening for diabetes mellitus: Secondary | ICD-10-CM

## 2016-01-12 DIAGNOSIS — O2652 Maternal hypotension syndrome, second trimester: Secondary | ICD-10-CM | POA: Insufficient documentation

## 2016-01-12 DIAGNOSIS — Z3402 Encounter for supervision of normal first pregnancy, second trimester: Secondary | ICD-10-CM

## 2016-01-12 DIAGNOSIS — O09522 Supervision of elderly multigravida, second trimester: Secondary | ICD-10-CM

## 2016-01-12 LAB — POCT URINALYSIS DIPSTICK
BILIRUBIN UA: NEGATIVE
GLUCOSE UA: NEGATIVE
KETONES UA: NEGATIVE
Leukocytes, UA: NEGATIVE
Nitrite, UA: NEGATIVE
Protein, UA: NEGATIVE
RBC UA: NEGATIVE
SPEC GRAV UA: 1.01
Urobilinogen, UA: 0.2
pH, UA: 7

## 2016-01-12 NOTE — Progress Notes (Signed)
Doing well.  Denies complaints.  Does report occasional feeling heart palpitations and dizziness (noted after working out).  BPs low, discussed maternal hypotension syndrome. . Advised on aggressive hydration prior to working out. RTC in 4 weeks, for 28 week labs at that time.

## 2016-02-17 ENCOUNTER — Encounter: Payer: 59 | Admitting: Obstetrics and Gynecology

## 2016-02-17 ENCOUNTER — Other Ambulatory Visit: Payer: 59

## 2016-02-29 ENCOUNTER — Ambulatory Visit (INDEPENDENT_AMBULATORY_CARE_PROVIDER_SITE_OTHER): Payer: 59 | Admitting: Obstetrics and Gynecology

## 2016-02-29 VITALS — BP 106/68 | HR 70 | Wt 145.4 lb

## 2016-02-29 DIAGNOSIS — O09523 Supervision of elderly multigravida, third trimester: Secondary | ICD-10-CM | POA: Diagnosis not present

## 2016-02-29 DIAGNOSIS — Z23 Encounter for immunization: Secondary | ICD-10-CM | POA: Diagnosis not present

## 2016-02-29 DIAGNOSIS — Z131 Encounter for screening for diabetes mellitus: Secondary | ICD-10-CM

## 2016-02-29 DIAGNOSIS — Z3402 Encounter for supervision of normal first pregnancy, second trimester: Secondary | ICD-10-CM

## 2016-02-29 LAB — POCT URINALYSIS DIPSTICK
BILIRUBIN UA: NEGATIVE
GLUCOSE UA: NEGATIVE
Ketones, UA: NEGATIVE
LEUKOCYTES UA: NEGATIVE
NITRITE UA: NEGATIVE
Protein, UA: NEGATIVE
RBC UA: NEGATIVE
Spec Grav, UA: 1.01
UROBILINOGEN UA: NEGATIVE
pH, UA: 7

## 2016-02-29 MED ORDER — TETANUS-DIPHTH-ACELL PERTUSSIS 5-2.5-18.5 LF-MCG/0.5 IM SUSP
0.5000 mL | Freq: Once | INTRAMUSCULAR | Status: AC
  Administered 2016-02-29: 0.5 mL via INTRAMUSCULAR

## 2016-02-29 NOTE — Progress Notes (Signed)
ROB: Doing well, no complaints.  For 28 week labs today.  Desires to breastfeed, desires condoms for contraception. For Tdap today, signed blood consent, discussed cord blood banking.  Has already received flu vaccine.  RTC in 2 weeks.

## 2016-03-01 LAB — CBC
HEMATOCRIT: 35.8 % (ref 34.0–46.6)
HEMOGLOBIN: 12 g/dL (ref 11.1–15.9)
MCH: 33 pg (ref 26.6–33.0)
MCHC: 33.5 g/dL (ref 31.5–35.7)
MCV: 98 fL — ABNORMAL HIGH (ref 79–97)
Platelets: 193 10*3/uL (ref 150–379)
RBC: 3.64 x10E6/uL — ABNORMAL LOW (ref 3.77–5.28)
RDW: 12.9 % (ref 12.3–15.4)
WBC: 8.3 10*3/uL (ref 3.4–10.8)

## 2016-03-01 LAB — GLUCOSE, 1 HOUR GESTATIONAL: GESTATIONAL DIABETES SCREEN: 46 mg/dL — AB (ref 65–139)

## 2016-03-01 LAB — VARICELLA ZOSTER ANTIBODY, IGG: VARICELLA: 892 {index} (ref >165)

## 2016-03-01 LAB — RUBELLA SCREEN: Rubella Antibodies, IGG: 3.13 index (ref >0.99)

## 2016-03-16 ENCOUNTER — Encounter: Payer: 59 | Admitting: Obstetrics and Gynecology

## 2016-03-16 ENCOUNTER — Ambulatory Visit (INDEPENDENT_AMBULATORY_CARE_PROVIDER_SITE_OTHER): Payer: 59 | Admitting: Obstetrics and Gynecology

## 2016-03-16 VITALS — BP 100/62 | HR 75 | Wt 148.2 lb

## 2016-03-16 DIAGNOSIS — O09523 Supervision of elderly multigravida, third trimester: Secondary | ICD-10-CM

## 2016-03-16 LAB — POCT URINALYSIS DIPSTICK
Bilirubin, UA: NEGATIVE
Blood, UA: NEGATIVE
Glucose, UA: NEGATIVE
KETONES UA: NEGATIVE
LEUKOCYTES UA: NEGATIVE
NITRITE UA: NEGATIVE
PH UA: 8
PROTEIN UA: NEGATIVE
Spec Grav, UA: 1.01
Urobilinogen, UA: NEGATIVE

## 2016-03-16 NOTE — Progress Notes (Signed)
ROB: Doing well, no complaints.  RTC in 2 weeks, for 36 week labs then.

## 2016-03-29 ENCOUNTER — Ambulatory Visit (INDEPENDENT_AMBULATORY_CARE_PROVIDER_SITE_OTHER): Payer: 59 | Admitting: Obstetrics and Gynecology

## 2016-03-29 VITALS — BP 97/59 | HR 75 | Wt 150.5 lb

## 2016-03-29 DIAGNOSIS — O09523 Supervision of elderly multigravida, third trimester: Secondary | ICD-10-CM | POA: Diagnosis not present

## 2016-03-29 DIAGNOSIS — Z3685 Encounter for antenatal screening for Streptococcus B: Secondary | ICD-10-CM

## 2016-03-29 DIAGNOSIS — Z113 Encounter for screening for infections with a predominantly sexual mode of transmission: Secondary | ICD-10-CM

## 2016-03-29 LAB — POCT URINALYSIS DIPSTICK
BILIRUBIN UA: NEGATIVE
Blood, UA: NEGATIVE
Glucose, UA: NEGATIVE
KETONES UA: 15
Nitrite, UA: NEGATIVE
Protein, UA: NEGATIVE
Spec Grav, UA: 1.02
Urobilinogen, UA: NEGATIVE
pH, UA: 7

## 2016-03-29 NOTE — Progress Notes (Signed)
ROB: Patient doing well, no complaints. For 36 week labs.  RTC in 1 week.

## 2016-04-02 LAB — CULTURE, BETA STREP (GROUP B ONLY): Strep Gp B Culture: NEGATIVE

## 2016-04-02 LAB — GC/CHLAMYDIA PROBE AMP
CHLAMYDIA, DNA PROBE: NEGATIVE
Neisseria gonorrhoeae by PCR: NEGATIVE

## 2016-04-04 ENCOUNTER — Ambulatory Visit (INDEPENDENT_AMBULATORY_CARE_PROVIDER_SITE_OTHER): Payer: 59 | Admitting: Obstetrics and Gynecology

## 2016-04-04 VITALS — BP 99/60 | HR 65 | Wt 150.8 lb

## 2016-04-04 DIAGNOSIS — O09523 Supervision of elderly multigravida, third trimester: Secondary | ICD-10-CM

## 2016-04-04 DIAGNOSIS — Z3480 Encounter for supervision of other normal pregnancy, unspecified trimester: Secondary | ICD-10-CM

## 2016-04-04 LAB — POCT URINALYSIS DIPSTICK
BILIRUBIN UA: NEGATIVE
Glucose, UA: NEGATIVE
KETONES UA: NEGATIVE
NITRITE UA: NEGATIVE
PH UA: 7.5
Protein, UA: NEGATIVE
RBC UA: NEGATIVE
Spec Grav, UA: 1.01
Urobilinogen, UA: NEGATIVE

## 2016-04-04 NOTE — Progress Notes (Signed)
ROB: Patient doing well, no complaints.  GBS neg.  RTC in 1 week. Reiterated labor precautions.

## 2016-04-11 ENCOUNTER — Ambulatory Visit (INDEPENDENT_AMBULATORY_CARE_PROVIDER_SITE_OTHER): Payer: 59 | Admitting: Obstetrics and Gynecology

## 2016-04-11 VITALS — BP 108/69 | HR 72 | Wt 152.3 lb

## 2016-04-11 DIAGNOSIS — Z0289 Encounter for other administrative examinations: Secondary | ICD-10-CM

## 2016-04-11 DIAGNOSIS — O09523 Supervision of elderly multigravida, third trimester: Secondary | ICD-10-CM

## 2016-04-11 LAB — POCT URINALYSIS DIPSTICK
Bilirubin, UA: NEGATIVE
Blood, UA: NEGATIVE
GLUCOSE UA: NEGATIVE
Ketones, UA: NEGATIVE
LEUKOCYTES UA: NEGATIVE
NITRITE UA: NEGATIVE
PROTEIN UA: NEGATIVE
Spec Grav, UA: 1.01
UROBILINOGEN UA: NEGATIVE
pH, UA: 6.5

## 2016-04-11 NOTE — Progress Notes (Signed)
ROB: Patient doing well, no complaints. Reiterated labor precautions. RTC in 1 week.

## 2016-04-20 ENCOUNTER — Ambulatory Visit (INDEPENDENT_AMBULATORY_CARE_PROVIDER_SITE_OTHER): Payer: 59 | Admitting: Obstetrics and Gynecology

## 2016-04-20 VITALS — BP 106/65 | HR 80 | Wt 153.0 lb

## 2016-04-20 DIAGNOSIS — O09523 Supervision of elderly multigravida, third trimester: Secondary | ICD-10-CM

## 2016-04-20 DIAGNOSIS — Z3483 Encounter for supervision of other normal pregnancy, third trimester: Secondary | ICD-10-CM

## 2016-04-20 LAB — POCT URINALYSIS DIPSTICK
Bilirubin, UA: NEGATIVE
Blood, UA: NEGATIVE
Glucose, UA: NEGATIVE
KETONES UA: NEGATIVE
Nitrite, UA: NEGATIVE
PH UA: 7.5
PROTEIN UA: NEGATIVE
Spec Grav, UA: <=1.005
UROBILINOGEN UA: NEGATIVE

## 2016-04-24 NOTE — Progress Notes (Signed)
Notes irregular contractions x 2 days and pelvic pressure. Labor precautions reiterated.  RTC in 1 week if undelivered.

## 2016-04-26 ENCOUNTER — Ambulatory Visit (INDEPENDENT_AMBULATORY_CARE_PROVIDER_SITE_OTHER): Payer: 59 | Admitting: Obstetrics and Gynecology

## 2016-04-26 VITALS — BP 115/70 | HR 85 | Wt 153.0 lb

## 2016-04-26 DIAGNOSIS — Z8759 Personal history of other complications of pregnancy, childbirth and the puerperium: Secondary | ICD-10-CM

## 2016-04-26 DIAGNOSIS — O480 Post-term pregnancy: Secondary | ICD-10-CM

## 2016-04-26 DIAGNOSIS — O09523 Supervision of elderly multigravida, third trimester: Secondary | ICD-10-CM

## 2016-04-26 LAB — POCT URINALYSIS DIPSTICK
BILIRUBIN UA: NEGATIVE
Blood, UA: NEGATIVE
GLUCOSE UA: NEGATIVE
KETONES UA: NEGATIVE
Nitrite, UA: NEGATIVE
Protein, UA: NEGATIVE
Spec Grav, UA: 1.02
Urobilinogen, UA: NEGATIVE
pH, UA: 7.5

## 2016-04-26 NOTE — Progress Notes (Signed)
ROB: Patient denies complaints. Declines pelvic exam. Desires to f/u next week for membrane stripping if no labor. Will order NST and growth scan/AFI check. RTC in 1 week if undelivered.

## 2016-04-27 ENCOUNTER — Ambulatory Visit (INDEPENDENT_AMBULATORY_CARE_PROVIDER_SITE_OTHER): Payer: 59 | Admitting: Obstetrics and Gynecology

## 2016-04-27 ENCOUNTER — Ambulatory Visit (INDEPENDENT_AMBULATORY_CARE_PROVIDER_SITE_OTHER): Payer: 59

## 2016-04-27 ENCOUNTER — Other Ambulatory Visit: Payer: 59

## 2016-04-27 ENCOUNTER — Other Ambulatory Visit: Payer: Self-pay | Admitting: Obstetrics and Gynecology

## 2016-04-27 VITALS — BP 105/66 | HR 72 | Wt 152.0 lb

## 2016-04-27 DIAGNOSIS — O480 Post-term pregnancy: Secondary | ICD-10-CM | POA: Diagnosis not present

## 2016-04-27 DIAGNOSIS — Z369 Encounter for antenatal screening, unspecified: Secondary | ICD-10-CM

## 2016-04-27 DIAGNOSIS — O09523 Supervision of elderly multigravida, third trimester: Secondary | ICD-10-CM

## 2016-04-27 DIAGNOSIS — Z1389 Encounter for screening for other disorder: Secondary | ICD-10-CM

## 2016-04-27 DIAGNOSIS — O26843 Uterine size-date discrepancy, third trimester: Secondary | ICD-10-CM

## 2016-04-27 DIAGNOSIS — Z8759 Personal history of other complications of pregnancy, childbirth and the puerperium: Secondary | ICD-10-CM

## 2016-04-27 LAB — POCT URINALYSIS DIPSTICK
BILIRUBIN UA: NEGATIVE
GLUCOSE UA: NEGATIVE
KETONES UA: NEGATIVE
Nitrite, UA: NEGATIVE
Protein, UA: NEGATIVE
RBC UA: NEGATIVE
SPEC GRAV UA: 1.01
Urobilinogen, UA: NEGATIVE
pH, UA: 8

## 2016-04-27 NOTE — Progress Notes (Signed)
NONSTRESS TEST INTERPRETATION  INDICATIONS:  Post dates  FHR baseline: 130 RESULTS: reactive COMMENTS: irregular contractions                          B/P-100/63, P-69   PLAN: 1. Continue fetal kick counts twice a day. 2. Continue antepartum testing as scheduled-Biweekly 3. RTO as scheduled  Martie Lee, LPN

## 2016-04-28 DIAGNOSIS — O480 Post-term pregnancy: Secondary | ICD-10-CM | POA: Insufficient documentation

## 2016-04-28 NOTE — Progress Notes (Signed)
NST performed today was reviewed and was found to be reactive.  Continue recommended antenatal testing and prenatal care, unless ultrasound reveals indication for sooner delivery.

## 2016-05-02 ENCOUNTER — Inpatient Hospital Stay: Payer: 59 | Admitting: Anesthesiology

## 2016-05-02 ENCOUNTER — Encounter: Payer: Self-pay | Admitting: *Deleted

## 2016-05-02 ENCOUNTER — Inpatient Hospital Stay
Admission: EM | Admit: 2016-05-02 | Discharge: 2016-05-04 | DRG: 775 | Disposition: A | Discharge status: Home / Self Care | Payer: 59 | Attending: Obstetrics and Gynecology | Admitting: Obstetrics and Gynecology

## 2016-05-02 ENCOUNTER — Ambulatory Visit (INDEPENDENT_AMBULATORY_CARE_PROVIDER_SITE_OTHER): Payer: 59 | Admitting: Obstetrics and Gynecology

## 2016-05-02 VITALS — BP 116/67 | HR 79 | Wt 151.6 lb

## 2016-05-02 DIAGNOSIS — Z3A41 41 weeks gestation of pregnancy: Secondary | ICD-10-CM | POA: Diagnosis not present

## 2016-05-02 DIAGNOSIS — O9962 Diseases of the digestive system complicating childbirth: Secondary | ICD-10-CM | POA: Diagnosis present

## 2016-05-02 DIAGNOSIS — O480 Post-term pregnancy: Secondary | ICD-10-CM | POA: Diagnosis not present

## 2016-05-02 DIAGNOSIS — Z3493 Encounter for supervision of normal pregnancy, unspecified, third trimester: Secondary | ICD-10-CM

## 2016-05-02 DIAGNOSIS — K5190 Ulcerative colitis, unspecified, without complications: Secondary | ICD-10-CM | POA: Diagnosis present

## 2016-05-02 DIAGNOSIS — Z3490 Encounter for supervision of normal pregnancy, unspecified, unspecified trimester: Secondary | ICD-10-CM | POA: Diagnosis not present

## 2016-05-02 LAB — POCT URINALYSIS DIPSTICK
Bilirubin, UA: NEGATIVE
Blood, UA: NEGATIVE
Glucose, UA: NEGATIVE
Ketones, UA: NEGATIVE
LEUKOCYTES UA: NEGATIVE
NITRITE UA: NEGATIVE
Spec Grav, UA: 1.01
UROBILINOGEN UA: 0.2
pH, UA: 6.5

## 2016-05-02 LAB — CBC
HEMATOCRIT: 38.2 % (ref 35.0–47.0)
HEMOGLOBIN: 13.3 g/dL (ref 12.0–16.0)
MCH: 33.2 pg (ref 26.0–34.0)
MCHC: 34.8 g/dL (ref 32.0–36.0)
MCV: 95.3 fL (ref 80.0–100.0)
PLATELETS: 170 10*3/uL (ref 150–440)
RBC: 4.01 MIL/uL (ref 3.80–5.20)
RDW: 12.5 % (ref 11.5–14.5)
WBC: 9.4 10*3/uL (ref 3.6–11.0)

## 2016-05-02 LAB — TYPE AND SCREEN
ABO/RH(D): O POS
Antibody Screen: NEGATIVE

## 2016-05-02 MED ORDER — LACTATED RINGERS IV SOLN
INTRAVENOUS | Status: DC
  Administered 2016-05-02: 13:00:00 via INTRAVENOUS

## 2016-05-02 MED ORDER — FENTANYL 2.5 MCG/ML W/ROPIVACAINE 0.2% IN NS 100 ML EPIDURAL INFUSION (ARMC-ANES)
EPIDURAL | Status: AC
  Filled 2016-05-02: qty 100

## 2016-05-02 MED ORDER — BUPIVACAINE HCL (PF) 0.25 % IJ SOLN
INTRAMUSCULAR | Status: DC | PRN
  Administered 2016-05-02: 10 mL via EPIDURAL
  Administered 2016-05-02: 5 mL via EPIDURAL

## 2016-05-02 MED ORDER — EPHEDRINE 5 MG/ML INJ
10.0000 mg | INTRAVENOUS | Status: DC | PRN
  Administered 2016-05-02: 10 mg via INTRAVENOUS
  Filled 2016-05-02: qty 2

## 2016-05-02 MED ORDER — AMMONIA AROMATIC IN INHA
RESPIRATORY_TRACT | Status: AC
  Filled 2016-05-02: qty 10

## 2016-05-02 MED ORDER — BUTORPHANOL TARTRATE 1 MG/ML IJ SOLN: 1.0000 mg | INTRAMUSCULAR | Status: DC | PRN

## 2016-05-02 MED ORDER — ONDANSETRON HCL 4 MG/2ML IJ SOLN: 4.0000 mg | Freq: Four times a day (QID) | INTRAMUSCULAR | Status: DC | PRN

## 2016-05-02 MED ORDER — OXYCODONE-ACETAMINOPHEN 5-325 MG PO TABS: 2.0000 | ORAL_TABLET | ORAL | Status: DC | PRN

## 2016-05-02 MED ORDER — ACETAMINOPHEN 325 MG PO TABS: 650.0000 mg | ORAL_TABLET | ORAL | Status: DC | PRN

## 2016-05-02 MED ORDER — TERBUTALINE SULFATE 1 MG/ML IJ SOLN
0.2500 mg | Freq: Once | INTRAMUSCULAR | Status: DC | PRN
  Filled 2016-05-02: qty 1

## 2016-05-02 MED ORDER — OXYTOCIN 40 UNITS IN LACTATED RINGERS INFUSION - SIMPLE MED: 2.5000 [IU]/h | INTRAVENOUS | Status: DC

## 2016-05-02 MED ORDER — OXYTOCIN 10 UNIT/ML IJ SOLN
INTRAMUSCULAR | Status: AC
  Filled 2016-05-02: qty 2

## 2016-05-02 MED ORDER — EPHEDRINE SULFATE 50 MG/ML IJ SOLN
INTRAMUSCULAR | Status: AC
  Administered 2016-05-02: 50 mg
  Filled 2016-05-02: qty 1

## 2016-05-02 MED ORDER — SOD CITRATE-CITRIC ACID 500-334 MG/5ML PO SOLN: 30.0000 mL | ORAL | Status: DC | PRN

## 2016-05-02 MED ORDER — OXYTOCIN 40 UNITS IN LACTATED RINGERS INFUSION - SIMPLE MED
1.0000 m[IU]/min | INTRAVENOUS | Status: DC
  Administered 2016-05-02: 1 m[IU]/min via INTRAVENOUS

## 2016-05-02 MED ORDER — FENTANYL 2.5 MCG/ML W/ROPIVACAINE 0.2% IN NS 100 ML EPIDURAL INFUSION (ARMC-ANES)
EPIDURAL | Status: DC | PRN
  Administered 2016-05-02: 10 mL/h via EPIDURAL

## 2016-05-02 MED ORDER — SODIUM CHLORIDE FLUSH 0.9 % IV SOLN
INTRAVENOUS | Status: AC
  Filled 2016-05-02: qty 10

## 2016-05-02 MED ORDER — LIDOCAINE HCL (PF) 1 % IJ SOLN
INTRAMUSCULAR | Status: AC
  Filled 2016-05-02: qty 30

## 2016-05-02 MED ORDER — LACTATED RINGERS IV SOLN
500.0000 mL | INTRAVENOUS | Status: DC | PRN
  Administered 2016-05-02: 500 mL via INTRAVENOUS

## 2016-05-02 MED ORDER — LIDOCAINE-EPINEPHRINE (PF) 1.5 %-1:200000 IJ SOLN
INTRAMUSCULAR | Status: DC | PRN
  Administered 2016-05-02: 4 mL via EPIDURAL

## 2016-05-02 MED ORDER — OXYCODONE-ACETAMINOPHEN 5-325 MG PO TABS: 1.0000 | ORAL_TABLET | ORAL | Status: DC | PRN

## 2016-05-02 MED ORDER — LIDOCAINE HCL (PF) 1 % IJ SOLN
30.0000 mL | INTRAMUSCULAR | Status: AC | PRN
  Administered 2016-05-02: 4 mL via SUBCUTANEOUS

## 2016-05-02 MED ORDER — MISOPROSTOL 200 MCG PO TABS
ORAL_TABLET | ORAL | Status: AC
  Filled 2016-05-02: qty 4

## 2016-05-02 MED ORDER — OXYTOCIN 40 UNITS IN LACTATED RINGERS INFUSION - SIMPLE MED: 1.0000 m[IU]/min | INTRAVENOUS | Status: DC

## 2016-05-02 MED ORDER — OXYTOCIN BOLUS FROM INFUSION: 500.0000 mL | Freq: Once | INTRAVENOUS | Status: DC

## 2016-05-02 NOTE — H&P (Signed)
OB History & Physical   History of Present Illness:  Chief Complaint: IOL for post dates  HPI:  Lindsay Owens is a 35 y.o. [phone] female at 64w0ddated by LMP consistent with 20 week ultrasound.  Her pregnancy has been complicated by advanced maternal age, maternal history of ulcerative colitis.    She reports contractions.   She denies leakage of fluid.   She denies vaginal bleeding.   She reports fetal movement.    Maternal Medical History:   Past Medical History:  Diagnosis Date  . Acute mastitis of left breast   . Ulcerative colitis ([place]    controlled    Past Surgical History:  Procedure Laterality Date  . NO PAST SURGERIES      No Known Allergies  Prior to Admission medications   Medication Sig Start Date End Date Taking? Authorizing Provider  Prenatal Vit-Fe Fumarate-FA (PRENATAL MULTIVITAMIN) TABS tablet Take 1 tablet by mouth daily at 12 noon.    Historical Provider, MD  Saccharomyces boulardii (PROBIOTIC) 250 MG CAPS Take by mouth.    Historical Provider, MD    OB History  Gravida Para Term Preterm AB Living  4 3 3     3   SAB TAB Ectopic Multiple Live Births          3    # Outcome Date GA Lbr Len/2nd Weight Sex Delivery Anes PTL Lv  4 Current           3 Term    8 lb 1.8 oz (3.679 kg)  Vag-Spont   LIV  2 Term    7 lb 2.1 oz (3.234 kg)  Vag-Spont   LIV  1 Term    8 lb 6.4 oz (3.81 kg)  Vag-Spont   LIV      Prenatal care site: [facility]  Social History: She  reports that she has never smoked. She has never used smokeless tobacco. She reports that she drinks alcohol. She reports that she does not use drugs.  Family History: family history includes Breast cancer in her maternal aunt; Heart disease in her maternal grandmother.   Review of Systems: Negative x 10 systems reviewed except as noted in the HPI.    Physical Exam:  Vital Signs: Ht 5' 7"  (1.702 m)   Wt 151 lb (68.5 kg)   LMP 07/20/2015 (Exact Date)   BMI 23.65 kg/m  General: no acute  distress.  HEENT: normocephalic, atraumatic Heart: regular rate & rhythm.  No murmurs/rubs/gallops Lungs: clear to auscultation bilaterally Abdomen: soft, gravid, non-tender;  EFW: 8.5 pounds Pelvic: (female chaperone present during pelvic exam)  External: Normal external female genitalia  Cervix: Dilation: 4.5 / Effacement (%): 80 / Station: -1   AROM: mod-thick meconium Extremities: non-tender, symmetric, no edema bilaterally.  DTRs: 2+  Neurologic: Alert & oriented x 3.    Pertinent Results:  Prenatal Labs: Blood type/Rh O positive  Antibody screen negative  Rubella Immune  Varicella Immune    RPR NR  HBsAg negative  HIV negative  GC negative  Chlamydia negative  Genetic screening Declined  1 hour GTT 46  3 hour GTT n/a  GBS negative on 03/29/2016    Baseline FHR: 150 beats/min   Variability: moderate   Accelerations: present   Decelerations: present (occasional periodic variable decelerations, shallow, short, with quick return to baseline) Contractions: present frequency: irregular  Overall assessment: category 1 overall with occasional variables making it category 2.  Assessment:  Lindsay MORGANis a 35y.o.  [id number] female at 6w0dwith IOL for dates.   Plan:  1. Admit to Labor & Delivery  2. CBC, T&S, Clrs, IVF 3. GBS negative.   4. Fetwal well-being: reassuring overall 5. AROM. Discussed meconium findings with patient and need for presence of pediatrics at delivery.  She voiced understanding and all questions were answered.  Anticipate vaginal delivery.   JWill Bonnet MD 05/02/2016 3:00 PM

## 2016-05-02 NOTE — Anesthesia Preprocedure Evaluation (Signed)
Anesthesia Evaluation  Patient identified by MRN, date of birth, ID band Patient awake    Reviewed: Allergy & Precautions, H&P , NPO status , Patient's Chart, lab work & pertinent test results, reviewed documented beta blocker date and time   Airway Mallampati: II  TM Distance: >3 FB Neck ROM: full    Dental no notable dental hx. (+) Teeth Intact   Pulmonary neg pulmonary ROS, Current Smoker,    Pulmonary exam normal breath sounds clear to auscultation       Cardiovascular Exercise Tolerance: Good negative cardio ROS   Rhythm:regular Rate:Normal     Neuro/Psych negative neurological ROS  negative psych ROS   GI/Hepatic negative GI ROS, Neg liver ROS, PUD,   Endo/Other  negative endocrine ROSdiabetes  Renal/GU      Musculoskeletal   Abdominal   Peds  Hematology negative hematology ROS (+)   Anesthesia Other Findings   Reproductive/Obstetrics (+) Pregnancy                             Anesthesia Physical Anesthesia Plan  ASA: II  Anesthesia Plan: Epidural   Post-op Pain Management:    Induction:   Airway Management Planned:   Additional Equipment:   Intra-op Plan:   Post-operative Plan:   Informed Consent: I have reviewed the patients History and Physical, chart, labs and discussed the procedure including the risks, benefits and alternatives for the proposed anesthesia with the patient or authorized representative who has indicated his/her understanding and acceptance.     Plan Discussed with:   Anesthesia Plan Comments:         Anesthesia Quick Evaluation

## 2016-05-02 NOTE — Progress Notes (Signed)
Patient ID: Lindsay Owens, female   DOB: 1981-04-28, 35 y.o.   MRN: [id number]  Labor Check  Subj:  Complaints: none. Not much change in contractions.    Obj:  BP 108/65 (BP Location: Left Arm)   Pulse 75   Temp 98.2 F (36.8 C) (Oral)   Resp 20   Ht 5' 7"  (1.702 m)   Wt 151 lb (68.5 kg)   LMP 07/20/2015 (Exact Date)   BMI 23.65 kg/m     Cervix: Dilation: 4.5 / Effacement (%): 80 / Station: -1  Baseline FHR: 135    Variability: moderate    Accelerations: present    Decelerations: absent Contractions: present frequency: irregular  A/P: 35 y.o. [id number] female at 78w0dwith IOL for post-dates.  1.  Labor: Will give another hour. If no significant change, will start pitocin.  2.  FWB: reassuring, Overall assessment: category 1  3.  GBS negative  4.  Pain: prn, may have epidural when ready 5.  Recheck: 1 hour or PRN.   SPrentice Docker MD 05/02/2016 4:58 PM

## 2016-05-02 NOTE — Progress Notes (Signed)
ROB-discussed IOL, prefers today, notified Dr Glennon Mac and he will assume care when she gets to hospital.

## 2016-05-02 NOTE — Discharge Summary (Signed)
OB Discharge Summary     Patient Name: Lindsay Owens DOB: [birth year] MRN: [id number]  Date of admission: 05/02/2016 Delivering MD: Will Bonnet, MD  Date of Delivery: 05/02/2016  Date of discharge: 05/04/2016  Admitting diagnosis: 41 wks, contractions Intrauterine pregnancy: [redacted]w[redacted]d    Secondary diagnosis: None     Discharge diagnosis: Term Pregnancy Delivered                                                                                                Post partum procedures:none  Augmentation: AROM and Pitocin  Complications: None  Hospital course:  Induction of Labor With Vaginal Delivery   35y.o. yo [id number] [id number] admitted to the hospital 05/02/2016 for induction of labor.  Indication for induction: Postdates.  Patient had an uncomplicated labor course as follows: Membrane Rupture Time/Date: 2:48 PM ,05/02/2016   Intrapartum Procedures: Episiotomy: None [1]                                         Lacerations:  1st degree [2]  Patient had delivery of a Viable infant.  Information for the patient's newborn:  AtPhaedra, [employer]0[[id number]]Delivery Method: Vag-Spont   05/02/2016  Details of delivery can be found in separate delivery note.  Patient had a routine postpartum course. Patient is discharged home 05/04/16.   Physical exam  Vitals:   05/04/16 0000 05/04/16 0340 05/04/16 0719 05/04/16 0721  BP: (!) 92/52 97/70 (!) 83/48 (!) 89/47  Pulse:  62 66 64  Resp:  12 18   Temp:  97.9 F (36.6 C) 98.4 F (36.9 C)   TempSrc:  Oral Oral   SpO2:  100% 100% 99%  Weight:      Height:       General: alert, cooperative and no distress Lochia: appropriate Uterine Fundus: firm Incision: N/A DVT Evaluation: No evidence of DVT seen on physical exam. No cords or calf tenderness. No significant calf/ankle edema.  Labs: Lab Results  Component Value Date   WBC 12.1 (H) 05/03/2016   HGB 12.4 05/03/2016   HCT 35.8 05/03/2016   MCV 94.5 05/03/2016   PLT 152  05/03/2016   Information for the patient's newborn:  AtSandrika, [place]0[[id number]]A NEG  Discharge instruction: per After Visit Summary.  Medications:    Medication List    TAKE these medications   ibuprofen 600 MG tablet Commonly known as:  ADVIL,MOTRIN Take 1 tablet (600 mg total) by mouth every 6 (six) hours.   prenatal multivitamin Tabs tablet Take 1 tablet by mouth daily at 12 noon.   Probiotic 250 MG Caps Take by mouth.       Diet: routine diet  Activity: Advance as tolerated. Pelvic rest for 6 weeks.   [facility] week([facility]).   Specialties:  Obstetrics and Gynecology, Radiology Why:  routine postpartum visit Contact information: [address]  [place] [address] [phone]           Postpartum contraception: condoms Rhogam Given postpartum: no Rubella vaccine given postpartum: no Varicella vaccine given postpartum: no TDaP given antepartum or postpartum: antepartum on 03/01/2016 Flu vaccine given antepartum or postpartum: antepartum on 02/23/2016  Newborn Data: Live born female Birth Weight: 8 lb 12 oz (3970 g) APGAR: 8, 9   Baby Feeding: Breast  Disposition:home with mother  SIGNED: Malachy Mood, MD

## 2016-05-02 NOTE — Progress Notes (Signed)
ROB- reports overall doing well. Has been having more frequent contractions this week but has not gotten closer together.

## 2016-05-02 NOTE — Progress Notes (Signed)
ROB- pt is having some pelvic pressure, contractions

## 2016-05-02 NOTE — Anesthesia Procedure Notes (Signed)
Epidural Patient location during procedure: OB  Staffing Performed: anesthesiologist   Preanesthetic Checklist Completed: patient identified, site marked, surgical consent, pre-op evaluation, timeout performed, IV checked, risks and benefits discussed and monitors and equipment checked  Epidural Patient position: sitting Prep: Betadine Patient monitoring: heart rate, continuous pulse ox and blood pressure Approach: midline Location: L3-L4 Injection technique: LOR saline  Needle:  Needle type: Tuohy  Needle gauge: 18 G Needle length: 9 cm and 9 Needle insertion depth: 6 cm Catheter type: closed end flexible Catheter size: 20 Guage Catheter at skin depth: 11 cm Test dose: negative and 1.5% lidocaine with Epi 1:200 K  Assessment Sensory level: T10 Events: blood not aspirated, injection not painful, no injection resistance, negative IV test and no paresthesia  Additional Notes   Patient tolerated the insertion well without complications.-SATD -IVTD. No paresthesia. Refer to [facility] for VS and dosingReason for block:procedure for pain

## 2016-05-02 NOTE — OB Triage Note (Signed)
G4P3 presents at 41w for scheduled labor induction.

## 2016-05-03 LAB — CBC
HEMATOCRIT: 35.8 % (ref 35.0–47.0)
Hemoglobin: 12.4 g/dL (ref 12.0–16.0)
MCH: 32.7 pg (ref 26.0–34.0)
MCHC: 34.6 g/dL (ref 32.0–36.0)
MCV: 94.5 fL (ref 80.0–100.0)
PLATELETS: 152 10*3/uL (ref 150–440)
RBC: 3.79 MIL/uL — ABNORMAL LOW (ref 3.80–5.20)
RDW: 12.4 % (ref 11.5–14.5)
WBC: 12.1 10*3/uL — ABNORMAL HIGH (ref 3.6–11.0)

## 2016-05-03 LAB — RPR: RPR Ser Ql: NONREACTIVE

## 2016-05-03 MED ORDER — ONDANSETRON HCL 4 MG/2ML IJ SOLN: 4.0000 mg | INTRAMUSCULAR | Status: DC | PRN

## 2016-05-03 MED ORDER — DIPHENHYDRAMINE HCL 25 MG PO CAPS: 25.0000 mg | ORAL_CAPSULE | Freq: Four times a day (QID) | ORAL | Status: DC | PRN

## 2016-05-03 MED ORDER — ONDANSETRON HCL 4 MG PO TABS: 4.0000 mg | ORAL_TABLET | ORAL | Status: DC | PRN

## 2016-05-03 MED ORDER — WITCH HAZEL-GLYCERIN EX PADS: 1.0000 "application " | MEDICATED_PAD | CUTANEOUS | Status: DC | PRN

## 2016-05-03 MED ORDER — PRENATAL MULTIVITAMIN CH
1.0000 | ORAL_TABLET | Freq: Every day | ORAL | Status: DC
  Administered 2016-05-03 – 2016-05-04 (×2): 1 via ORAL
  Filled 2016-05-03 (×2): qty 1

## 2016-05-03 MED ORDER — FERROUS SULFATE 325 (65 FE) MG PO TABS
325.0000 mg | ORAL_TABLET | Freq: Two times a day (BID) | ORAL | Status: DC
  Administered 2016-05-03 – 2016-05-04 (×3): 325 mg via ORAL
  Filled 2016-05-03 (×3): qty 1

## 2016-05-03 MED ORDER — ACETAMINOPHEN 325 MG PO TABS: 650.0000 mg | ORAL_TABLET | ORAL | Status: DC | PRN

## 2016-05-03 MED ORDER — SENNOSIDES-DOCUSATE SODIUM 8.6-50 MG PO TABS
2.0000 | ORAL_TABLET | ORAL | Status: DC
  Administered 2016-05-04: 2 via ORAL
  Filled 2016-05-03: qty 2

## 2016-05-03 MED ORDER — SIMETHICONE 80 MG PO CHEW: 80.0000 mg | CHEWABLE_TABLET | ORAL | Status: DC | PRN

## 2016-05-03 MED ORDER — COCONUT OIL OIL: 1.0000 "application " | TOPICAL_OIL | Status: DC | PRN

## 2016-05-03 MED ORDER — HYDROCODONE-ACETAMINOPHEN 5-325 MG PO TABS: 1.0000 | ORAL_TABLET | Freq: Four times a day (QID) | ORAL | Status: DC | PRN

## 2016-05-03 MED ORDER — DIBUCAINE 1 % RE OINT: 1.0000 "application " | TOPICAL_OINTMENT | RECTAL | Status: DC | PRN

## 2016-05-03 MED ORDER — IBUPROFEN 600 MG PO TABS
600.0000 mg | ORAL_TABLET | Freq: Four times a day (QID) | ORAL | Status: DC
  Administered 2016-05-03 – 2016-05-04 (×3): 600 mg via ORAL
  Filled 2016-05-03 (×4): qty 1

## 2016-05-03 MED ORDER — BENZOCAINE-MENTHOL 20-0.5 % EX AERO
1.0000 "application " | INHALATION_SPRAY | CUTANEOUS | Status: DC | PRN
  Filled 2016-05-03: qty 56

## 2016-05-03 NOTE — Progress Notes (Signed)
Admit Date: 05/02/2016 Today's Date: 05/03/2016  Post Partum Day 1  Subjective:  no complaints, up ad lib, voiding and tolerating PO  Objective: Temp:  [97.9 F (36.6 C)-98.2 F (36.8 C)] 98.2 F (36.8 C) (11/22 0726) Pulse Rate:  [57-98] 66 (11/22 0726) Resp:  [15-20] 20 (11/22 0726) BP: (82-126)/(34-80) 98/57 (11/22 0726) SpO2:  [99 %-100 %] 100 % (11/22 0301) Weight:  [151 lb (68.5 kg)] 151 lb (68.5 kg) (11/21 1317)  Physical Exam:  General: alert, cooperative and no distress Lochia: appropriate Uterine Fundus: firm Incision: none DVT Evaluation: No evidence of DVT seen on physical exam.   Recent Labs  05/02/16 1431 05/03/16 0617  HGB 13.3 12.4  HCT 38.2 35.8    Assessment/Plan: Plan for discharge tomorrow, Breastfeeding, Contraception plans condoms and Infant doing well   LOS: 1 day   Martin 05/03/2016, 11:19 AM

## 2016-05-03 NOTE — Anesthesia Postprocedure Evaluation (Signed)
Anesthesia Post Note  Patient: Lindsay Owens  Procedure(s) Performed: * No procedures listed *  Patient location during evaluation: Mother Baby Anesthesia Type: Epidural Level of consciousness: awake and alert Pain management: pain level controlled Respiratory status: spontaneous breathing Cardiovascular status: blood pressure returned to baseline Postop Assessment: no headache, epidural receding and patient able to bend at knees Anesthetic complications: no    Last Vitals:  Vitals:   05/03/16 0301 05/03/16 0726  BP: 95/62 (!) 98/57  Pulse: 64 66  Resp: 20 20  Temp: 36.6 C 36.8 C    Last Pain:  Vitals:   05/03/16 0726  TempSrc: Oral  PainSc:                  [name]

## 2016-05-04 MED ORDER — IBUPROFEN 600 MG PO TABS: 600.0000 mg | ORAL_TABLET | Freq: Four times a day (QID) | ORAL | 0 refills | Status: DC

## 2016-05-04 NOTE — Progress Notes (Signed)
Patient discharged home with infant and spouse. Discharge instructions, prescriptions and follow up appointment given to and reviewed with patient and spouse. Patient verbalized understanding. Escorted out via wheelchair by [place] M, NT.

## 2016-06-15 ENCOUNTER — Ambulatory Visit (INDEPENDENT_AMBULATORY_CARE_PROVIDER_SITE_OTHER): Payer: 59 | Admitting: Obstetrics and Gynecology

## 2016-06-15 ENCOUNTER — Encounter: Payer: Self-pay | Admitting: Obstetrics and Gynecology

## 2016-06-15 NOTE — Progress Notes (Signed)
   OBSTETRICS POSTPARTUM CLINIC PROGRESS NOTE  Subjective:     Lindsay Owens is a 36 y.o. [phone] female who presents for a postpartum visit. She is 6 weeks postpartum following a spontaneous vaginal delivery. I have fully reviewed the prenatal and intrapartum course. The delivery was at 43 gestational weeks.  Anesthesia: epidural. Postpartum course has been well. Baby's course has been well. Baby is feeding by breast. Bleeding: patient has not resumed menses, with No LMP recorded.. Bowel function is normal. Bladder function is normal. Patient is not sexually active. Contraception method desired is condoms. Postpartum depression screening: negative. PHQ-9 score 1.  The following portions of the patient's history were reviewed and updated as appropriate: allergies, current medications, past family history, past medical history, past social history, past surgical history and problem list.  Review of Systems Pertinent items noted in HPI and remainder of comprehensive ROS otherwise negative.   Objective:    BP 96/60 (BP Location: Left Arm, Patient Position: Sitting)   Pulse 74   Wt 135 lb 6.4 oz (61.4 kg)   Breastfeeding? Yes   BMI 21.21 kg/m     General:  alert and no distress   Breasts:  inspection negative, no nipple discharge or bleeding, no masses or nodularity palpable  Lungs: clear to auscultation bilaterally  Heart:  regular rate and rhythm, S1, S2 normal, no murmur, click, rub or gallop  Abdomen: soft, non-tender; bowel sounds normal; no masses,  no organomegaly.     Vulva:  normal  Vagina: normal vagina, no discharge, exudate, lesion, or erythema  Cervix:  no cervical motion tenderness and no lesions  Corpus: normal size, contour, position, consistency, mobility, non-tender  Adnexa:  normal adnexa and no mass, fullness, tenderness  Rectal Exam: Not performed.         Labs:  Lab Results  Component Value Date   HGB 12.4 05/03/2016     Assessment:    Routine postpartum  exam s/p vaginal delivery.    Plan:   1. Contraception: condoms 2. Can resume all activities 3. Follow up in: 3 months for annual exam with Dr. Enzo Bi, or sooner as needed.    Rubie Maid, MD [facility]

## 2016-06-16 ENCOUNTER — Encounter: Payer: Self-pay | Admitting: Obstetrics and Gynecology

## 2016-11-13 NOTE — Progress Notes (Signed)
Patient ID: Lindsay Owens, female   DOB: 1981/05/19, 36 y.o.   MRN: [id number] ANNUAL PREVENTATIVE CARE GYN  ENCOUNTER NOTE  Subjective:       Lindsay Owens is a 36 y.o. [phone] female here for a routine annual gynecologic exam.  Current complaints: 1.  none   Patient continues to breast-feed 6 months. No menses. Bowel function is normal; ulcerative colitis is stable Bladder function is normal.    Gynecologic History No LMP recorded. Patient is not currently having periods (Reason: Lactating). Contraception: condoms Last Pap: 07/03/2013 neg/neg. Results were: normal Last mammogram: n/a. Results were: n/a  Obstetric History OB History  Gravida Para Term Preterm AB Living  4 4 4     4   SAB TAB Ectopic Multiple Live Births        0 4    # Outcome Date GA Lbr Len/2nd Weight Sex Delivery Anes PTL Lv  4 Term 05/02/16 [redacted]w[redacted]d/ 01:22 8 lb 12 oz (3.97 kg) F Vag-Spont EPI  LIV  3 Term    8 lb 1.8 oz (3.679 kg)  Vag-Spont   LIV  2 Term    7 lb 2.1 oz (3.234 kg)  Vag-Spont   LIV  1 Term    8 lb 6.4 oz (3.81 kg)  Vag-Spont   LIV      Past Medical History:  Diagnosis Date  . Acute mastitis of left breast   . Ulcerative colitis ([place]    controlled    Past Surgical History:  Procedure Laterality Date  . NO PAST SURGERIES      No current outpatient prescriptions on file prior to visit.   No current facility-administered medications on file prior to visit.     No Known Allergies  Social History   Social History  . Marital status: Married    Spouse name: N/A  . Number of children: N/A  . Years of education: N/A   Occupational History  . Not on file.   Social History Main Topics  . Smoking status: Never Smoker  . Smokeless tobacco: Never Used  . Alcohol use Yes     Comment: rare  . Drug use: No  . Sexual activity: Not Currently    Birth control/ protection: Condom   Other Topics Concern  . Not on file   Social History Narrative  . No narrative on file     Family History  Problem Relation Age of Onset  . Breast cancer Maternal Aunt   . Heart disease Maternal Grandmother   . Colon cancer Neg Hx   . Ovarian cancer Neg Hx   . Diabetes Neg Hx     The following portions of the patient's history were reviewed and updated as appropriate: allergies, current medications, past family history, past medical history, past social history, past surgical history and problem list.  Review of Systems Review of Systems  Constitutional: Negative.  Negative for chills and fever.  HENT: Negative.   Eyes: Negative.   Cardiovascular: Negative.   Gastrointestinal: Negative.  Negative for constipation and diarrhea.  Genitourinary: Negative for dysuria, frequency and urgency.       Lactational amenorrhea  Musculoskeletal: Negative.   Skin: Negative.   Neurological: Negative.   Endo/Heme/Allergies: Negative.   Psychiatric/Behavioral: Negative.   All other systems reviewed and are negative.    Objective:  BP (!) 84/51   Pulse 69   Ht 5' 7"  (1.702 m)   Wt 122 lb 14.4 oz (55.7 kg)  LMP  (LMP Unknown)   Breastfeeding? Yes   BMI 19.25 kg/m  CONSTITUTIONAL: Well-developed, well-nourished female in no acute distress.  PSYCHIATRIC: Normal mood and affect. Normal behavior. Normal judgment and thought content. Skyline: Alert and oriented to person, place, and time. Normal muscle tone coordination. No cranial nerve deficit noted. HENT:  Normocephalic, atraumatic, External right and left ear normal.  NECK: Normal range of motion, supple, no masses.  Normal thyroid.  CARDIOVASCULAR: Normal heart rate noted, regular rhythm, no murmur. RESPIRATORY: Clear to auscultation bilaterally. Effort and breath sounds normal, no problems with respiration noted. BREASTS: Symmetric in size. No masses, skin changes, nipple drainage, or lymphadenopathy. ABDOMEN: Soft, normal bowel sounds, no distention noted.  No tenderness, rebound or guarding.  BLADDER:  Normal PELVIC:  External Genitalia: Normal  BUS: Normal  Vagina: Normal; mild atrophic changes from breast-feeding  Cervix: Normal; no cervical motion tenderness  Uterus: Normal; midplane, normal size and shape, mobile  Adnexa: Right adnexal and cystic mass 5 cm, mobile, nontender (suspect bowel versus adnexa)  RV: External Exam NormaI  LYMPHATIC: No Axillary, Supraclavicular, or Inguinal Adenopathy.    Assessment:   Annual gynecologic examination 36 y.o. Contraception: condoms bmi-19 Lactational amenorrhea Right adnexal cystic mass-5 cm, probable loop of bowel Plan:  Pap: Pap/hpv Mammogram: Not Indicated Stool Guaiac Testing:  Not Indicated Labs: lipid a1c fbs tsh Routine preventative health maintenance measures emphasized: Exercise/Diet/Weight control, Tobacco Warnings, Alcohol/Substance use risks, Stress Management, Peer Pressure Issues and Safe Sex Pelvic ultrasound Return to Clinic - Caroleen, CMA  Brayton Mars, MD  Note: This dictation was prepared with Dragon dictation along with smaller phrase technology. Any transcriptional errors that result from this process are unintentional.

## 2016-11-15 ENCOUNTER — Encounter: Payer: Self-pay | Admitting: Obstetrics and Gynecology

## 2016-11-15 ENCOUNTER — Ambulatory Visit (INDEPENDENT_AMBULATORY_CARE_PROVIDER_SITE_OTHER): Payer: 59 | Admitting: Obstetrics and Gynecology

## 2016-11-15 VITALS — BP 84/51 | HR 69 | Ht 67.0 in | Wt 122.9 lb

## 2016-11-15 DIAGNOSIS — N912 Amenorrhea, unspecified: Secondary | ICD-10-CM | POA: Insufficient documentation

## 2016-11-15 DIAGNOSIS — N9489 Other specified conditions associated with female genital organs and menstrual cycle: Secondary | ICD-10-CM | POA: Insufficient documentation

## 2016-11-15 DIAGNOSIS — N949 Unspecified condition associated with female genital organs and menstrual cycle: Secondary | ICD-10-CM | POA: Diagnosis not present

## 2016-11-15 DIAGNOSIS — Z01419 Encounter for gynecological examination (general) (routine) without abnormal findings: Secondary | ICD-10-CM | POA: Diagnosis not present

## 2016-11-15 NOTE — Patient Instructions (Signed)
1. Pap smear is done 2. Self breast awareness encouraged 3. Continue with healthy eating and exercise 4. Recommend calcium with vitamin D supplementation 1200 mg daily 5. Pelvic ultrasound is ordered to assess right adnexal mass 6. Return in 1 year for annual exam  Health Maintenance, Female Adopting a healthy lifestyle and getting preventive care can go a long way to promote health and wellness. Talk with your health care provider about what schedule of regular examinations is right for you. This is a good chance for you to check in with your provider about disease prevention and staying healthy. In between checkups, there are plenty of things you can do on your own. Experts have done a lot of research about which lifestyle changes and preventive measures are most likely to keep you healthy. Ask your health care provider for more information. Weight and diet Eat a healthy diet  Be sure to include plenty of vegetables, fruits, low-fat dairy products, and lean protein.  Do not eat a lot of foods high in solid fats, added sugars, or salt.  Get regular exercise. This is one of the most important things you can do for your health. ? Most adults should exercise for at least 150 minutes each week. The exercise should increase your heart rate and make you sweat (moderate-intensity exercise). ? Most adults should also do strengthening exercises at least twice a week. This is in addition to the moderate-intensity exercise.  Maintain a healthy weight  Body mass index (BMI) is a measurement that can be used to identify possible weight problems. It estimates body fat based on height and weight. Your health care provider can help determine your BMI and help you achieve or maintain a healthy weight.  For females 87 years of age and older: ? A BMI below 18.5 is considered underweight. ? A BMI of 18.5 to 24.9 is normal. ? A BMI of 25 to 29.9 is considered overweight. ? A BMI of 30 and above is  considered obese.  Watch levels of cholesterol and blood lipids  You should start having your blood tested for lipids and cholesterol at 37 years of age, then have this test every 5 years.  You may need to have your cholesterol levels checked more often if: ? Your lipid or cholesterol levels are high. ? You are older than 37 years of age. ? You are at high risk for heart disease.  Cancer screening Lung Cancer  Lung cancer screening is recommended for adults 28-48 years old who are at high risk for lung cancer because of a history of smoking.  A yearly low-dose CT scan of the lungs is recommended for people who: ? Currently smoke. ? Have quit within the past 15 years. ? Have at least a 30-pack-year history of smoking. A pack year is smoking an average of one pack of cigarettes a day for 1 year.  Yearly screening should continue until it has been 15 years since you quit.  Yearly screening should stop if you develop a health problem that would prevent you from having lung cancer treatment.  Breast Cancer  Practice breast self-awareness. This means understanding how your breasts normally appear and feel.  It also means doing regular breast self-exams. Let your health care provider know about any changes, no matter how small.  If you are in your 20s or 30s, you should have a clinical breast exam (CBE) by a health care provider every 1-3 years as part of a regular health exam.  If you are 40 or older, have a CBE every year. Also consider having a breast X-ray (mammogram) every year.  If you have a family history of breast cancer, talk to your health care provider about genetic screening.  If you are at high risk for breast cancer, talk to your health care provider about having an MRI and a mammogram every year.  Breast cancer gene (BRCA) assessment is recommended for women who have family members with BRCA-related cancers. BRCA-related cancers  include: ? Breast. ? Ovarian. ? Tubal. ? Peritoneal cancers.  Results of the assessment will determine the need for genetic counseling and BRCA1 and BRCA2 testing.  Cervical Cancer Your health care provider may recommend that you be screened regularly for cancer of the pelvic organs (ovaries, uterus, and vagina). This screening involves a pelvic examination, including checking for microscopic changes to the surface of your cervix (Pap test). You may be encouraged to have this screening done every 3 years, beginning at age 21.  For women ages 30-65, health care providers may recommend pelvic exams and Pap testing every 3 years, or they may recommend the Pap and pelvic exam, combined with testing for human papilloma virus (HPV), every 5 years. Some types of HPV increase your risk of cervical cancer. Testing for HPV may also be done on women of any age with unclear Pap test results.  Other health care providers may not recommend any screening for nonpregnant women who are considered low risk for pelvic cancer and who do not have symptoms. Ask your health care provider if a screening pelvic exam is right for you.  If you have had past treatment for cervical cancer or a condition that could lead to cancer, you need Pap tests and screening for cancer for at least 20 years after your treatment. If Pap tests have been discontinued, your risk factors (such as having a new sexual partner) need to be reassessed to determine if screening should resume. Some women have medical problems that increase the chance of getting cervical cancer. In these cases, your health care provider may recommend more frequent screening and Pap tests.  Colorectal Cancer  This type of cancer can be detected and often prevented.  Routine colorectal cancer screening usually begins at 37 years of age and continues through 37 years of age.  Your health care provider may recommend screening at an earlier age if you have risk factors  for colon cancer.  Your health care provider may also recommend using home test kits to check for hidden blood in the stool.  A small camera at the end of a tube can be used to examine your colon directly (sigmoidoscopy or colonoscopy). This is done to check for the earliest forms of colorectal cancer.  Routine screening usually begins at age 50.  Direct examination of the colon should be repeated every 5-10 years through 37 years of age. However, you may need to be screened more often if early forms of precancerous polyps or small growths are found.  Skin Cancer  Check your skin from head to toe regularly.  Tell your health care provider about any new moles or changes in moles, especially if there is a change in a mole's shape or color.  Also tell your health care provider if you have a mole that is larger than the size of a pencil eraser.  Always use sunscreen. Apply sunscreen liberally and repeatedly throughout the day.  Protect yourself by wearing long sleeves, pants, a wide-brimmed hat, and   sunglasses whenever you are outside.  Heart disease, diabetes, and high blood pressure  High blood pressure causes heart disease and increases the risk of stroke. High blood pressure is more likely to develop in: ? People who have blood pressure in the high end of the normal range (130-139/85-89 mm Hg). ? People who are overweight or obese. ? People who are African American.  If you are 25-70 years of age, have your blood pressure checked every 3-5 years. If you are 86 years of age or older, have your blood pressure checked every year. You should have your blood pressure measured twice-once when you are at a hospital or clinic, and once when you are not at a hospital or clinic. Record the average of the two measurements. To check your blood pressure when you are not at a hospital or clinic, you can use: ? An automated blood pressure machine at a pharmacy. ? A home blood pressure monitor.  If  you are between 34 years and 33 years old, ask your health care provider if you should take aspirin to prevent strokes.  Have regular diabetes screenings. This involves taking a blood sample to check your fasting blood sugar level. ? If you are at a normal weight and have a low risk for diabetes, have this test once every three years after 37 years of age. ? If you are overweight and have a high risk for diabetes, consider being tested at a younger age or more often. Preventing infection Hepatitis B  If you have a higher risk for hepatitis B, you should be screened for this virus. You are considered at high risk for hepatitis B if: ? You were born in a country where hepatitis B is common. Ask your health care provider which countries are considered high risk. ? Your parents were born in a high-risk country, and you have not been immunized against hepatitis B (hepatitis B vaccine). ? You have HIV or AIDS. ? You use needles to inject street drugs. ? You live with someone who has hepatitis B. ? You have had sex with someone who has hepatitis B. ? You get hemodialysis treatment. ? You take certain medicines for conditions, including cancer, organ transplantation, and autoimmune conditions.  Hepatitis C  Blood testing is recommended for: ? Everyone born from [birth year] through 1966. ? Anyone with known risk factors for hepatitis C.  Sexually transmitted infections (STIs)  You should be screened for sexually transmitted infections (STIs) including gonorrhea and chlamydia if: ? You are sexually active and are younger than 37 years of age. ? You are older than 37 years of age and your health care provider tells you that you are at risk for this type of infection. ? Your sexual activity has changed since you were last screened and you are at an increased risk for chlamydia or gonorrhea. Ask your health care provider if you are at risk.  If you do not have HIV, but are at risk, it may be recommended  that you take a prescription medicine daily to prevent HIV infection. This is called pre-exposure prophylaxis (PrEP). You are considered at risk if: ? You are sexually active and do not regularly use condoms or know the HIV status of your partner(s). ? You take drugs by injection. ? You are sexually active with a partner who has HIV.  Talk with your health care provider about whether you are at high risk of being infected with HIV. If you choose to begin PrEP, you  should first be tested for HIV. You should then be tested every 3 months for as long as you are taking PrEP. Pregnancy  If you are premenopausal and you may become pregnant, ask your health care provider about preconception counseling.  If you may become pregnant, take 400 to 800 micrograms (mcg) of folic acid every day.  If you want to prevent pregnancy, talk to your health care provider about birth control (contraception). Osteoporosis and menopause  Osteoporosis is a disease in which the bones lose minerals and strength with aging. This can result in serious bone fractures. Your risk for osteoporosis can be identified using a bone density scan.  If you are 65 years of age or older, or if you are at risk for osteoporosis and fractures, ask your health care provider if you should be screened.  Ask your health care provider whether you should take a calcium or vitamin D supplement to lower your risk for osteoporosis.  Menopause may have certain physical symptoms and risks.  Hormone replacement therapy may reduce some of these symptoms and risks. Talk to your health care provider about whether hormone replacement therapy is right for you. Follow these instructions at home:  Schedule regular health, dental, and eye exams.  Stay current with your immunizations.  Do not use any tobacco products including cigarettes, chewing tobacco, or electronic cigarettes.  If you are pregnant, do not drink alcohol.  If you are  breastfeeding, limit how much and how often you drink alcohol.  Limit alcohol intake to no more than 1 drink per day for nonpregnant women. One drink equals 12 ounces of beer, 5 ounces of wine, or 1 ounces of hard liquor.  Do not use street drugs.  Do not share needles.  Ask your health care provider for help if you need support or information about quitting drugs.  Tell your health care provider if you often feel depressed.  Tell your health care provider if you have ever been abused or do not feel safe at home. This information is not intended to replace advice given to you by your health care provider. Make sure you discuss any questions you have with your health care provider. Document Released: 12/12/2010 Document Revised: 11/04/2015 Document Reviewed: 03/02/2015 Elsevier Interactive Patient Education  2018 [employer]  

## 2016-11-17 LAB — PAP IG AND HPV HIGH-RISK
HPV, HIGH-RISK: NEGATIVE
PAP Smear Comment: 0

## 2016-11-21 ENCOUNTER — Ambulatory Visit (INDEPENDENT_AMBULATORY_CARE_PROVIDER_SITE_OTHER): Payer: 59

## 2016-11-21 DIAGNOSIS — N9489 Other specified conditions associated with female genital organs and menstrual cycle: Secondary | ICD-10-CM

## 2016-11-21 DIAGNOSIS — N949 Unspecified condition associated with female genital organs and menstrual cycle: Secondary | ICD-10-CM

## 2016-11-28 ENCOUNTER — Other Ambulatory Visit: Payer: 59

## 2016-11-28 DIAGNOSIS — Z01419 Encounter for gynecological examination (general) (routine) without abnormal findings: Secondary | ICD-10-CM | POA: Diagnosis not present

## 2016-11-29 LAB — LIPID PANEL
Chol/HDL Ratio: 2.7 ratio (ref 0.0–4.4)
Cholesterol, Total: 133 mg/dL (ref 100–199)
HDL: 49 mg/dL (ref >39)
LDL Calculated: 72 mg/dL (ref 0–99)
TRIGLYCERIDES: 58 mg/dL (ref 0–149)
VLDL Cholesterol Cal: 12 mg/dL (ref 5–40)

## 2016-11-29 LAB — TSH

## 2016-11-29 LAB — GLUCOSE, RANDOM: GLUCOSE: 83 mg/dL (ref 65–99)

## 2016-11-29 LAB — HEMOGLOBIN A1C
Est. average glucose Bld gHb Est-mCnc: 108 mg/dL
HEMOGLOBIN A1C: 5.4 % (ref 4.8–5.6)

## 2016-12-04 ENCOUNTER — Telehealth: Payer: Self-pay

## 2016-12-04 ENCOUNTER — Other Ambulatory Visit: Payer: Self-pay

## 2016-12-04 DIAGNOSIS — R7989 Other specified abnormal findings of blood chemistry: Secondary | ICD-10-CM

## 2016-12-04 NOTE — Telephone Encounter (Signed)
Pt aware per vm.  

## 2016-12-04 NOTE — Telephone Encounter (Signed)
Refer to endocinology Dr Gabriel Carina for low tsh.

## 2016-12-08 ENCOUNTER — Other Ambulatory Visit: Payer: 59

## 2016-12-08 DIAGNOSIS — R7989 Other specified abnormal findings of blood chemistry: Secondary | ICD-10-CM

## 2016-12-08 DIAGNOSIS — R946 Abnormal results of thyroid function studies: Secondary | ICD-10-CM | POA: Diagnosis not present

## 2016-12-09 LAB — THYROID PANEL WITH TSH
Free Thyroxine Index: 2.5 (ref 1.2–4.9)
T3 UPTAKE RATIO: 28 % (ref 24–39)
T4, Total: 9.1 ug/dL (ref 4.5–12.0)
TSH: 0.009 u[IU]/mL — ABNORMAL LOW (ref 0.450–4.500)

## 2017-03-06 DIAGNOSIS — E0590 Thyrotoxicosis, unspecified without thyrotoxic crisis or storm: Secondary | ICD-10-CM | POA: Diagnosis not present

## 2017-08-02 ENCOUNTER — Telehealth: Payer: Self-pay | Admitting: Obstetrics and Gynecology

## 2017-08-02 NOTE — Telephone Encounter (Signed)
The patient called wanting to speak with a nurse wanting to know what if she is able to come in and do labs for a thyroid f/u. No other information was disclosed. Please advise.

## 2017-08-03 ENCOUNTER — Other Ambulatory Visit: Payer: Self-pay | Admitting: Obstetrics and Gynecology

## 2017-08-03 ENCOUNTER — Other Ambulatory Visit: Payer: Self-pay

## 2017-08-03 ENCOUNTER — Other Ambulatory Visit: Payer: 59

## 2017-08-03 DIAGNOSIS — R000 Tachycardia, unspecified: Secondary | ICD-10-CM

## 2017-08-03 NOTE — Telephone Encounter (Signed)
Pt would like tsh panel. She had check with dr Gabriel Carina 6 months ago - all normal. Recently she has had increased hrt rate. Tsh panel ordered. Lab appt made for today at 2:00.

## 2017-08-04 LAB — THYROID PANEL WITH TSH
Free Thyroxine Index: 1.7 (ref 1.2–4.9)
T3 Uptake Ratio: 26 % (ref 24–39)
T4 TOTAL: 6.5 ug/dL (ref 4.5–12.0)
TSH: 1.31 u[IU]/mL (ref 0.450–4.500)

## 2017-11-19 NOTE — Progress Notes (Signed)
Patient ID: Lindsay Owens, female   DOB: 1980/08/24, 37 y.o.   MRN: [id number] ANNUAL PREVENTATIVE CARE GYN  ENCOUNTER NOTE  Subjective:       Lindsay Owens is a 37 y.o. [phone] female here for a routine annual gynecologic exam.  Current complaints:  1.  none     Bowel function is normal; ulcerative colitis is stable Bladder function is normal. Patient is taking calcium with vitamin D and multivitamins daily. Current contraception is condoms. Patient stopped breast-feeding in November 2018 and has had the junction of normal menses.  Gynecologic History lmp-11/24/2017 Contraception: condoms Last Pap: 11/15/2016 neg/neg. Results were: normal Last mammogram: n/a. Results were: n/a  Obstetric History OB History  Gravida Para Term Preterm AB Living  4 4 4     4   SAB TAB Ectopic Multiple Live Births        0 4    # Outcome Date GA Lbr Len/2nd Weight Sex Delivery Anes PTL Lv  4 Term 05/02/16 [redacted]w[redacted]d/ 01:22 8 lb 12 oz (3.97 kg) F Vag-Spont EPI  LIV  3 Term    8 lb 1.8 oz (3.679 kg)  Vag-Spont   LIV  2 Term    7 lb 2.1 oz (3.234 kg)  Vag-Spont   LIV  1 Term    8 lb 6.4 oz (3.81 kg)  Vag-Spont   LIV    Past Medical History:  Diagnosis Date  . Acute mastitis of left breast   . Ulcerative colitis ([facility]    controlled    Past Surgical History:  Procedure Laterality Date  . NO PAST SURGERIES      Current Outpatient Medications on File Prior to Visit  Medication Sig Dispense Refill  . Multiple Vitamin (MULTI-VITAMINS) TABS Take by mouth.    . Probiotic Product (PROBIOTIC-10 PO) Take by mouth.     No current facility-administered medications on file prior to visit.     No Known Allergies  Social History   Socioeconomic History  . Marital status: Married    Spouse name: Not on file  . Number of children: Not on file  . Years of education: Not on file  . Highest education level: Not on file  Occupational History  . Not on file  Social Needs  . Financial resource strain:  Not on file  . Food insecurity:    Worry: Not on file    Inability: Not on file  . Transportation needs:    Medical: Not on file    Non-medical: Not on file  Tobacco Use  . Smoking status: Never Smoker  . Smokeless tobacco: Never Used  Substance and Sexual Activity  . Alcohol use: Yes    Comment: rare  . Drug use: No  . Sexual activity: Yes    Birth control/protection: Condom  Lifestyle  . Physical activity:    Days per week: Not on file    Minutes per session: Not on file  . Stress: Not on file  Relationships  . Social connections:    Talks on phone: Not on file    Gets together: Not on file    Attends religious service: Not on file    Active member of club or organization: Not on file    Attends meetings of clubs or organizations: Not on file    Relationship status: Not on file  . Intimate partner violence:    Fear of current or ex partner: Not on file    Emotionally abused: Not  on file    Physically abused: Not on file    Forced sexual activity: Not on file  Other Topics Concern  . Not on file  Social History Narrative  . Not on file    Family History  Problem Relation Age of Onset  . Breast cancer Maternal Aunt   . Heart disease Maternal Grandmother   . Colon cancer Neg Hx   . Ovarian cancer Neg Hx   . Diabetes Neg Hx     The following portions of the patient's history were reviewed and updated as appropriate: allergies, current medications, past family history, past medical history, past social history, past surgical history and problem list.  Review of Systems    Objective:  BP 93/61   Pulse 75   Ht 5' 7"  (1.702 m)   Wt 120 lb (54.4 kg)   LMP 11/24/2017 (Exact Date)   Breastfeeding? No   BMI 18.79 kg/m  CONSTITUTIONAL: Well-developed, well-nourished female in no acute distress.  PSYCHIATRIC: Normal mood and affect. Normal behavior. Normal judgment and thought content. [name]: Alert and oriented to person, place, and time. Normal muscle tone  coordination. No cranial nerve deficit noted. HENT:  Normocephalic, atraumatic, External right and left ear normal.  NECK: Normal range of motion, supple, no masses.  Normal thyroid.  CARDIOVASCULAR: Normal heart rate noted, regular rhythm, no murmur. RESPIRATORY: Clear to auscultation bilaterally. Effort and breath sounds normal, no problems with respiration noted. BREASTS: Symmetric in size. No masses, skin changes, nipple drainage, or lymphadenopathy. ABDOMEN: Soft, no distention noted.  No tenderness, rebound or guarding.  No hernias BLADDER: Normal; nontender PELVIC:  External Genitalia: Normal  BUS: Normal  Vagina: Normal estrogen effect; no discharge  Cervix: Normal; no cervical motion tenderness; parous; no discharge  Uterus: Normal; midplane, normal size and shape, mobile  Adnexa: No palpable masses or tenderness  RV: External Exam NormaI  LYMPHATIC: No Axillary, Supraclavicular, or Inguinal Adenopathy. EXTREMITIES: History of edema    Assessment:   Annual gynecologic examination 37 y.o. Contraception: condoms bmi-18 Ulcerative colitis, stable  Plan:  Pap: Due 2021 Mammogram: Not Indicated Stool Guaiac Testing:  Not Indicated Labs: lipid a1c fbs tsh Routine preventative health maintenance measures emphasized: Exercise/Diet/Weight control, Tobacco Warnings, Alcohol/Substance use risks, Stress Management, Peer Pressure Issues and Safe Sex Return to [place], CMA  Brayton Mars, MD  Note: This dictation was prepared with Dragon dictation along with smaller phrase technology. Any transcriptional errors that result from this process are unintentional.

## 2017-11-20 ENCOUNTER — Encounter: Payer: 59 | Admitting: Obstetrics and Gynecology

## 2017-11-29 ENCOUNTER — Other Ambulatory Visit: Payer: 59

## 2017-11-29 ENCOUNTER — Encounter: Payer: Self-pay | Admitting: Obstetrics and Gynecology

## 2017-11-29 ENCOUNTER — Ambulatory Visit (INDEPENDENT_AMBULATORY_CARE_PROVIDER_SITE_OTHER): Payer: 59 | Admitting: Obstetrics and Gynecology

## 2017-11-29 VITALS — BP 93/61 | HR 75 | Ht 67.0 in | Wt 120.0 lb

## 2017-11-29 DIAGNOSIS — IMO0001 Reserved for inherently not codable concepts without codable children: Secondary | ICD-10-CM

## 2017-11-29 DIAGNOSIS — Z01419 Encounter for gynecological examination (general) (routine) without abnormal findings: Secondary | ICD-10-CM

## 2017-11-29 DIAGNOSIS — Z789 Other specified health status: Secondary | ICD-10-CM

## 2017-11-29 DIAGNOSIS — K5180 Other ulcerative colitis without complications: Secondary | ICD-10-CM

## 2017-11-29 NOTE — Patient Instructions (Addendum)
1.  No Pap smear is done.  Next Pap smear is due in 2021. 2.  Self breast awareness is encouraged 3.  Screening labs are obtained today. 4.  Continue with healthy eating and exercise 5.  Contraception-condoms 6.  Continue with multivitamins daily and calcium with vitamin D supplementation daily 7.  Return in 1 year for annual exam  Health Maintenance, Female Adopting a healthy lifestyle and getting preventive care can go a long way to promote health and wellness. Talk with your health care provider about what schedule of regular examinations is right for you. This is a good chance for you to check in with your provider about disease prevention and staying healthy. In between checkups, there are plenty of things you can do on your own. Experts have done a lot of research about which lifestyle changes and preventive measures are most likely to keep you healthy. Ask your health care provider for more information. Weight and diet Eat a healthy diet  Be sure to include plenty of vegetables, fruits, low-fat dairy products, and lean protein.  Do not eat a lot of foods high in solid fats, added sugars, or salt.  Get regular exercise. This is one of the most important things you can do for your health. ? Most adults should exercise for at least 150 minutes each week. The exercise should increase your heart rate and make you sweat (moderate-intensity exercise). ? Most adults should also do strengthening exercises at least twice a week. This is in addition to the moderate-intensity exercise.  Maintain a healthy weight  Body mass index (BMI) is a measurement that can be used to identify possible weight problems. It estimates body fat based on height and weight. Your health care provider can help determine your BMI and help you achieve or maintain a healthy weight.  For females 19 years of age and older: ? A BMI below 18.5 is considered underweight. ? A BMI of 18.5 to 24.9 is normal. ? A BMI of 25 to  29.9 is considered overweight. ? A BMI of 30 and above is considered obese.  Watch levels of cholesterol and blood lipids  You should start having your blood tested for lipids and cholesterol at 38 years of age, then have this test every 5 years.  You may need to have your cholesterol levels checked more often if: ? Your lipid or cholesterol levels are high. ? You are older than 38 years of age. ? You are at high risk for heart disease.  Cancer screening Lung Cancer  Lung cancer screening is recommended for adults 20-27 years old who are at high risk for lung cancer because of a history of smoking.  A yearly low-dose CT scan of the lungs is recommended for people who: ? Currently smoke. ? Have quit within the past 15 years. ? Have at least a 30-pack-year history of smoking. A pack year is smoking an average of one pack of cigarettes a day for 1 year.  Yearly screening should continue until it has been 15 years since you quit.  Yearly screening should stop if you develop a health problem that would prevent you from having lung cancer treatment.  Breast Cancer  Practice breast self-awareness. This means understanding how your breasts normally appear and feel.  It also means doing regular breast self-exams. Let your health care provider know about any changes, no matter how small.  If you are in your 20s or 30s, you should have a clinical breast exam (  CBE) by a health care provider every 1-3 years as part of a regular health exam.  If you are 72 or older, have a CBE every year. Also consider having a breast X-ray (mammogram) every year.  If you have a family history of breast cancer, talk to your health care provider about genetic screening.  If you are at high risk for breast cancer, talk to your health care provider about having an MRI and a mammogram every year.  Breast cancer gene (BRCA) assessment is recommended for women who have family members with BRCA-related cancers.  BRCA-related cancers include: ? Breast. ? Ovarian. ? Tubal. ? Peritoneal cancers.  Results of the assessment will determine the need for genetic counseling and BRCA1 and BRCA2 testing.  Cervical Cancer Your health care provider may recommend that you be screened regularly for cancer of the pelvic organs (ovaries, uterus, and vagina). This screening involves a pelvic examination, including checking for microscopic changes to the surface of your cervix (Pap test). You may be encouraged to have this screening done every 3 years, beginning at age 46.  For women ages 51-65, health care providers may recommend pelvic exams and Pap testing every 3 years, or they may recommend the Pap and pelvic exam, combined with testing for human papilloma virus (HPV), every 5 years. Some types of HPV increase your risk of cervical cancer. Testing for HPV may also be done on women of any age with unclear Pap test results.  Other health care providers may not recommend any screening for nonpregnant women who are considered low risk for pelvic cancer and who do not have symptoms. Ask your health care provider if a screening pelvic exam is right for you.  If you have had past treatment for cervical cancer or a condition that could lead to cancer, you need Pap tests and screening for cancer for at least 20 years after your treatment. If Pap tests have been discontinued, your risk factors (such as having a new sexual partner) need to be reassessed to determine if screening should resume. Some women have medical problems that increase the chance of getting cervical cancer. In these cases, your health care provider may recommend more frequent screening and Pap tests.  Colorectal Cancer  This type of cancer can be detected and often prevented.  Routine colorectal cancer screening usually begins at 38 years of age and continues through 38 years of age.  Your health care provider may recommend screening at an earlier age if  you have risk factors for colon cancer.  Your health care provider may also recommend using home test kits to check for hidden blood in the stool.  A small camera at the end of a tube can be used to examine your colon directly (sigmoidoscopy or colonoscopy). This is done to check for the earliest forms of colorectal cancer.  Routine screening usually begins at age 42.  Direct examination of the colon should be repeated every 5-10 years through 38 years of age. However, you may need to be screened more often if early forms of precancerous polyps or small growths are found.  Skin Cancer  Check your skin from head to toe regularly.  Tell your health care provider about any new moles or changes in moles, especially if there is a change in a mole's shape or color.  Also tell your health care provider if you have a mole that is larger than the size of a pencil eraser.  Always use sunscreen. Apply sunscreen liberally  and repeatedly throughout the day.  Protect yourself by wearing long sleeves, pants, a wide-brimmed hat, and sunglasses whenever you are outside.  Heart disease, diabetes, and high blood pressure  High blood pressure causes heart disease and increases the risk of stroke. High blood pressure is more likely to develop in: ? People who have blood pressure in the high end of the normal range (130-139/85-89 mm Hg). ? People who are overweight or obese. ? People who are African American.  If you are 8-42 years of age, have your blood pressure checked every 3-5 years. If you are 83 years of age or older, have your blood pressure checked every year. You should have your blood pressure measured twice-once when you are at a hospital or clinic, and once when you are not at a hospital or clinic. Record the average of the two measurements. To check your blood pressure when you are not at a hospital or clinic, you can use: ? An automated blood pressure machine at a pharmacy. ? A home blood  pressure monitor.  If you are between 25 years and 84 years old, ask your health care provider if you should take aspirin to prevent strokes.  Have regular diabetes screenings. This involves taking a blood sample to check your fasting blood sugar level. ? If you are at a normal weight and have a low risk for diabetes, have this test once every three years after 38 years of age. ? If you are overweight and have a high risk for diabetes, consider being tested at a younger age or more often. Preventing infection Hepatitis B  If you have a higher risk for hepatitis B, you should be screened for this virus. You are considered at high risk for hepatitis B if: ? You were born in a country where hepatitis B is common. Ask your health care provider which countries are considered high risk. ? Your parents were born in a high-risk country, and you have not been immunized against hepatitis B (hepatitis B vaccine). ? You have HIV or AIDS. ? You use needles to inject street drugs. ? You live with someone who has hepatitis B. ? You have had sex with someone who has hepatitis B. ? You get hemodialysis treatment. ? You take certain medicines for conditions, including cancer, organ transplantation, and autoimmune conditions.  Hepatitis C  Blood testing is recommended for: ? Everyone born from [birth year] through 1966. ? Anyone with known risk factors for hepatitis C.  Sexually transmitted infections (STIs)  You should be screened for sexually transmitted infections (STIs) including gonorrhea and chlamydia if: ? You are sexually active and are younger than 38 years of age. ? You are older than 38 years of age and your health care provider tells you that you are at risk for this type of infection. ? Your sexual activity has changed since you were last screened and you are at an increased risk for chlamydia or gonorrhea. Ask your health care provider if you are at risk.  If you do not have HIV, but are at risk,  it may be recommended that you take a prescription medicine daily to prevent HIV infection. This is called pre-exposure prophylaxis (PrEP). You are considered at risk if: ? You are sexually active and do not regularly use condoms or know the HIV status of your partner(s). ? You take drugs by injection. ? You are sexually active with a partner who has HIV.  Talk with your health care provider about whether  you are at high risk of being infected with HIV. If you choose to begin PrEP, you should first be tested for HIV. You should then be tested every 3 months for as long as you are taking PrEP. Pregnancy  If you are premenopausal and you may become pregnant, ask your health care provider about preconception counseling.  If you may become pregnant, take 400 to 800 micrograms (mcg) of folic acid every day.  If you want to prevent pregnancy, talk to your health care provider about birth control (contraception). Osteoporosis and menopause  Osteoporosis is a disease in which the bones lose minerals and strength with aging. This can result in serious bone fractures. Your risk for osteoporosis can be identified using a bone density scan.  If you are 35 years of age or older, or if you are at risk for osteoporosis and fractures, ask your health care provider if you should be screened.  Ask your health care provider whether you should take a calcium or vitamin D supplement to lower your risk for osteoporosis.  Menopause may have certain physical symptoms and risks.  Hormone replacement therapy may reduce some of these symptoms and risks. Talk to your health care provider about whether hormone replacement therapy is right for you. Follow these instructions at home:  Schedule regular health, dental, and eye exams.  Stay current with your immunizations.  Do not use any tobacco products including cigarettes, chewing tobacco, or electronic cigarettes.  If you are pregnant, do not drink  alcohol.  If you are breastfeeding, limit how much and how often you drink alcohol.  Limit alcohol intake to no more than 1 drink per day for nonpregnant women. One drink equals 12 ounces of beer, 5 ounces of wine, or 1 ounces of hard liquor.  Do not use street drugs.  Do not share needles.  Ask your health care provider for help if you need support or information about quitting drugs.  Tell your health care provider if you often feel depressed.  Tell your health care provider if you have ever been abused or do not feel safe at home. This information is not intended to replace advice given to you by your health care provider. Make sure you discuss any questions you have with your health care provider. Document Released: 12/12/2010 Document Revised: 11/04/2015 Document Reviewed: 03/02/2015 Elsevier Interactive Patient Education  [employer].

## 2017-11-30 LAB — LIPID PANEL
CHOLESTEROL TOTAL: 125 mg/dL (ref 100–199)
Chol/HDL Ratio: 2.8 ratio (ref 0.0–4.4)
HDL: 45 mg/dL (ref >39)
LDL Calculated: 66 mg/dL (ref 0–99)
Triglycerides: 69 mg/dL (ref 0–149)
VLDL Cholesterol Cal: 14 mg/dL (ref 5–40)

## 2017-11-30 LAB — HEMOGLOBIN A1C
ESTIMATED AVERAGE GLUCOSE: 105 mg/dL
Hgb A1c MFr Bld: 5.3 % (ref 4.8–5.6)

## 2017-11-30 LAB — GLUCOSE, RANDOM: GLUCOSE: 87 mg/dL (ref 65–99)

## 2017-11-30 LAB — TSH: TSH: 1.18 u[IU]/mL (ref 0.450–4.500)

## 2018-05-02 ENCOUNTER — Ambulatory Visit: Payer: Self-pay | Admitting: Nurse Practitioner

## 2018-05-02 VITALS — BP 95/65 | HR 71 | Temp 98.5°F | Resp 16 | Wt 126.8 lb

## 2018-05-02 DIAGNOSIS — J029 Acute pharyngitis, unspecified: Secondary | ICD-10-CM

## 2018-05-02 NOTE — Patient Instructions (Signed)
Pharyngitis Take medication as prescribed. -Tylenol for pain, fever, or general discomfort. -Increase fluids. -Warm saltwater gargles 3-4 times daily as needed for throat pain or discomfort. -May use a teaspoon of honey or over-the-counter cough drops to help with cough. -Follow-up if symptoms do not improve or rapidly worsen.  Pharyngitis is redness, pain, and swelling (inflammation) of the throat (pharynx). It is a very common cause of sore throat. Pharyngitis can be caused by a bacteria, but it is usually caused by a virus. Most cases of pharyngitis get better on their own without treatment. What are the causes? This condition may be caused by:  Infection by viruses (viral). Viral pharyngitis spreads from person to person (is contagious) through coughing, sneezing, and sharing of personal items or utensils such as cups, forks, spoons, and toothbrushes.  Infection by bacteria (bacterial). Bacterial pharyngitis may be spread by touching the nose or face after coming in contact with the bacteria, or through more intimate contact, such as kissing.  Allergies. Allergies can cause buildup of mucus in the throat (post-nasal drip), leading to inflammation and irritation. Allergies can also cause blocked nasal passages, forcing breathing through the mouth, which dries and irritates the throat.  What increases the risk? You are more likely to develop this condition if:  You are 45-18 years old.  You are exposed to crowded environments such as daycare, school, or dormitory living.  You live in a cold climate.  You have a weakened disease-fighting (immune) system.  What are the signs or symptoms? Symptoms of this condition vary by the cause (viral, bacterial, or allergies) and can include:  Sore throat.  Fatigue.  Low-grade fever.  Headache.  Joint pain and muscle aches.  Skin rashes.  Swollen glands in the throat (lymph nodes).  Plaque-like film on the throat or tonsils. This is  often a symptom of bacterial pharyngitis.  Vomiting.  Stuffy nose (nasal congestion).  Cough.  Red, itchy eyes (conjunctivitis).  Loss of appetite.  How is this diagnosed? This condition is often diagnosed based on your medical history and a physical exam. Your health care provider will ask you questions about your illness and your symptoms. A swab of your throat may be done to check for bacteria (rapid strep test). Other lab tests may also be done, depending on the suspected cause, but these are rare. How is this treated? This condition usually gets better in 3-4 days without medicine. Bacterial pharyngitis may be treated with antibiotic medicines. Follow these instructions at home:  Take over-the-counter and prescription medicines only as told by your health care provider. ? If you were prescribed an antibiotic medicine, take it as told by your health care provider. Do not stop taking the antibiotic even if you start to feel better. ? Do not give children aspirin because of the association with Reye syndrome.  Drink enough water and fluids to keep your urine clear or pale yellow.  Get a lot of rest.  Gargle with a salt-water mixture 3-4 times a day or as needed. To make a salt-water mixture, completely dissolve -1 tsp of salt in 1 cup of warm water.  If your health care provider approves, you may use throat lozenges or sprays to soothe your throat. Contact a health care provider if:  You have large, tender lumps in your neck.  You have a rash.  You cough up green, yellow-brown, or bloody spit. Get help right away if:  Your neck becomes stiff.  You drool or are unable to  swallow liquids.  You cannot drink or take medicines without vomiting.  You have severe pain that does not go away, even after you take medicine.  You have trouble breathing, and it is not caused by a stuffy nose.  You have new pain and swelling in your joints such as the knees, ankles, wrists, or  elbows. Summary  Pharyngitis is redness, pain, and swelling (inflammation) of the throat (pharynx).  While pharyngitis can be caused by a bacteria, the most common causes are viral.  Most cases of pharyngitis get better on their own without treatment.  Bacterial pharyngitis is treated with antibiotic medicines. This information is not intended to replace advice given to you by your health care provider. Make sure you discuss any questions you have with your health care provider. Document Released: 05/29/2005 Document Revised: 07/04/2016 Document Reviewed: 07/04/2016 Elsevier Interactive Patient Education  [employer].

## 2018-05-02 NOTE — Progress Notes (Signed)
Subjective:     Lindsay Owens is a 37 y.o. female who presents for evaluation of sore throat. Associated symptoms include suspected fevers but not measured at home, sore throat and fatigue. Onset of symptoms was today  and have been gradually worsening since that time. She is drinking moderate amounts of fluids. She has not had a recent close exposure to someone with proven streptococcal pharyngitis.  The following portions of the patient's history were reviewed and updated as appropriate: allergies, current medications and past medical history.  Review of Systems Constitutional: positive for anorexia, chills, fatigue and fevers, negative for malaise and sweats Eyes: negative Ears, nose, mouth, throat, and face: positive for sore throat, negative for ear drainage, earaches, hoarseness and nasal congestion Respiratory: negative Cardiovascular: negative Gastrointestinal: positive for decreased appetitie, negative for abdominal pain, diarrhea, nausea and vomiting Neurological: negative    Objective:    BP 95/65 (BP Location: Right Arm, Patient Position: Sitting, Cuff Size: Normal)   Pulse 71   Temp 98.5 F (36.9 C) (Oral)   Resp 16   Wt 126 lb 12.8 oz (57.5 kg)   SpO2 98%   BMI 19.86 kg/m  General appearance: alert, cooperative, fatigued and no distress Head: Normocephalic, without obvious abnormality, atraumatic Eyes: conjunctivae/corneas clear. PERRL, EOM's intact. Fundi benign. Ears: normal TM's and external ear canals both ears Nose: Nares normal. Septum midline. Mucosa normal. No drainage or sinus tenderness. Throat: abnormal findings: moderate oropharyngeal erythema, mild oropharyngeal edema, tonsils 0 bilaterally, no exudate present Lungs: clear to auscultation bilaterally Heart: regular rate and rhythm, S1, S2 normal, no murmur, click, rub or gallop Abdomen: soft, non-tender; bowel sounds normal; no masses,  no organomegaly Pulses: 2+ and symmetric Skin: Skin color, texture,  turgor normal. No rashes or lesions Lymph nodes: cervical and submandibular nodes normal Neurologic: Grossly normal  Laboratory Strep test done. Results:negative.    Assessment:    Acute pharyngitis, likely  Viral pharyngitis.    Plan:   Exam findings, diagnosis etiology and medication use and indications reviewed with patient. Follow- Up and discharge instructions provided. No emergent/urgent issues found on exam.  Discussed with patient that due to her clinical presentation, to include no tonsillar edema, exudates, duration of symptoms, felt that this was viral at this time.  Patient also had a negative strep test in the office.  Discussed with patient to perform symptomatic treatment to include Tylenol, warm salt water gargles, and over-the-counter medicines.  Informed patient that if symptoms do not improve within the next 7 to 10 days, her symptoms rapidly worsen to include high fever, or onset of new symptoms to return to the office.  Patient education was provided. Patient verbalized understanding of information provided and agrees with plan of care (POC), all questions answered. The patient is advised to call or return to clinic if condition does not see an improvement in symptoms, or to seek the care of the closest emergency department if condition worsens with the above plan.   1. Sore throat  - POCT rapid strep A-negative -Tylenol for pain, fever, or general discomfort. -Increase fluids. -Warm saltwater gargles 3-4 times daily as needed for throat pain or discomfort. -May use a teaspoon of honey or over-the-counter cough drops to help with cough. -Follow-up if symptoms do not improve or rapidly worsen.

## 2018-12-03 ENCOUNTER — Telehealth: Payer: Self-pay

## 2018-12-03 NOTE — Progress Notes (Signed)
Pt is present today for annal exam. Pt stated she was doing well and denies any issues at this time.

## 2018-12-03 NOTE — Telephone Encounter (Signed)
[facility] for prescreening.  

## 2018-12-03 NOTE — Patient Instructions (Addendum)
Health Maintenance, Female Adopting a healthy lifestyle and getting preventive care can go a long way to promote health and wellness. Talk with your health care provider about what schedule of regular examinations is right for you. This is a good chance for you to check in with your provider about disease prevention and staying healthy. In between checkups, there are plenty of things you can do on your own. Experts have done a lot of research about which lifestyle changes and preventive measures are most likely to keep you healthy. Ask your health care provider for more information. Weight and diet Eat a healthy diet  Be sure to include plenty of vegetables, fruits, low-fat dairy products, and lean protein.  Do not eat a lot of foods high in solid fats, added sugars, or salt.  Get regular exercise. This is one of the most important things you can do for your health. ? Most adults should exercise for at least 150 minutes each week. The exercise should increase your heart rate and make you sweat (moderate-intensity exercise). ? Most adults should also do strengthening exercises at least twice a week. This is in addition to the moderate-intensity exercise. Maintain a healthy weight  Body mass index (BMI) is a measurement that can be used to identify possible weight problems. It estimates body fat based on height and weight. Your health care provider can help determine your BMI and help you achieve or maintain a healthy weight.  For females 20 years of age and older: ? A BMI below 18.5 is considered underweight. ? A BMI of 18.5 to 24.9 is normal. ? A BMI of 25 to 29.9 is considered overweight. ? A BMI of 30 and above is considered obese. Watch levels of cholesterol and blood lipids  You should start having your blood tested for lipids and cholesterol at 39 years of age, then have this test every 5 years.  You may need to have your cholesterol levels checked more often if: ? Your lipid or  cholesterol levels are high. ? You are older than 39 years of age. ? You are at high risk for heart disease. Cancer screening Lung Cancer  Lung cancer screening is recommended for adults 55-80 years old who are at high risk for lung cancer because of a history of smoking.  A yearly low-dose CT scan of the lungs is recommended for people who: ? Currently smoke. ? Have quit within the past 15 years. ? Have at least a 30-pack-year history of smoking. A pack year is smoking an average of one pack of cigarettes a day for 1 year.  Yearly screening should continue until it has been 15 years since you quit.  Yearly screening should stop if you develop a health problem that would prevent you from having lung cancer treatment. Breast Cancer  Practice breast self-awareness. This means understanding how your breasts normally appear and feel.  It also means doing regular breast self-exams. Let your health care provider know about any changes, no matter how small.  If you are in your 20s or 30s, you should have a clinical breast exam (CBE) by a health care provider every 1-3 years as part of a regular health exam.  If you are 40 or older, have a CBE every year. Also consider having a breast X-ray (mammogram) every year.  If you have a family history of breast cancer, talk to your health care provider about genetic screening.  If you are at high risk for breast cancer, talk   to your health care provider about having an MRI and a mammogram every year.  Breast cancer gene (BRCA) assessment is recommended for women who have family members with BRCA-related cancers. BRCA-related cancers include: ? Breast. ? Ovarian. ? Tubal. ? Peritoneal cancers.  Results of the assessment will determine the need for genetic counseling and BRCA1 and BRCA2 testing. Cervical Cancer Your health care provider may recommend that you be screened regularly for cancer of the pelvic organs (ovaries, uterus, and vagina).  This screening involves a pelvic examination, including checking for microscopic changes to the surface of your cervix (Pap test). You may be encouraged to have this screening done every 3 years, beginning at age 21.  For women ages 30-65, health care providers may recommend pelvic exams and Pap testing every 3 years, or they may recommend the Pap and pelvic exam, combined with testing for human papilloma virus (HPV), every 5 years. Some types of HPV increase your risk of cervical cancer. Testing for HPV may also be done on women of any age with unclear Pap test results.  Other health care providers may not recommend any screening for nonpregnant women who are considered low risk for pelvic cancer and who do not have symptoms. Ask your health care provider if a screening pelvic exam is right for you.  If you have had past treatment for cervical cancer or a condition that could lead to cancer, you need Pap tests and screening for cancer for at least 20 years after your treatment. If Pap tests have been discontinued, your risk factors (such as having a new sexual partner) need to be reassessed to determine if screening should resume. Some women have medical problems that increase the chance of getting cervical cancer. In these cases, your health care provider may recommend more frequent screening and Pap tests. Colorectal Cancer  This type of cancer can be detected and often prevented.  Routine colorectal cancer screening usually begins at 39 years of age and continues through 39 years of age.  Your health care provider may recommend screening at an earlier age if you have risk factors for colon cancer.  Your health care provider may also recommend using home test kits to check for hidden blood in the stool.  A small camera at the end of a tube can be used to examine your colon directly (sigmoidoscopy or colonoscopy). This is done to check for the earliest forms of colorectal cancer.  Routine  screening usually begins at age 50.  Direct examination of the colon should be repeated every 5-10 years through 39 years of age. However, you may need to be screened more often if early forms of precancerous polyps or small growths are found. Skin Cancer  Check your skin from head to toe regularly.  Tell your health care provider about any new moles or changes in moles, especially if there is a change in a mole's shape or color.  Also tell your health care provider if you have a mole that is larger than the size of a pencil eraser.  Always use sunscreen. Apply sunscreen liberally and repeatedly throughout the day.  Protect yourself by wearing long sleeves, pants, a wide-brimmed hat, and sunglasses whenever you are outside. Heart disease, diabetes, and high blood pressure  High blood pressure causes heart disease and increases the risk of stroke. High blood pressure is more likely to develop in: ? People who have blood pressure in the high end of the normal range (130-139/85-89 mm Hg). ? People   who are overweight or obese. ? People who are African American.  If you are 84-22 years of age, have your blood pressure checked every 3-5 years. If you are 67 years of age or older, have your blood pressure checked every year. You should have your blood pressure measured twice-once when you are at a hospital or clinic, and once when you are not at a hospital or clinic. Record the average of the two measurements. To check your blood pressure when you are not at a hospital or clinic, you can use: ? An automated blood pressure machine at a pharmacy. ? A home blood pressure monitor.  If you are between 52 years and 3 years old, ask your health care provider if you should take aspirin to prevent strokes.  Have regular diabetes screenings. This involves taking a blood sample to check your fasting blood sugar level. ? If you are at a normal weight and have a low risk for diabetes, have this test once  every three years after 39 years of age. ? If you are overweight and have a high risk for diabetes, consider being tested at a younger age or more often. Preventing infection Hepatitis B  If you have a higher risk for hepatitis B, you should be screened for this virus. You are considered at high risk for hepatitis B if: ? You were born in a country where hepatitis B is common. Ask your health care provider which countries are considered high risk. ? Your parents were born in a high-risk country, and you have not been immunized against hepatitis B (hepatitis B vaccine). ? You have HIV or AIDS. ? You use needles to inject street drugs. ? You live with someone who has hepatitis B. ? You have had sex with someone who has hepatitis B. ? You get hemodialysis treatment. ? You take certain medicines for conditions, including cancer, organ transplantation, and autoimmune conditions. Hepatitis C  Blood testing is recommended for: ? Everyone born from [birth year] through 1966. ? Anyone with known risk factors for hepatitis C. Sexually transmitted infections (STIs)  You should be screened for sexually transmitted infections (STIs) including gonorrhea and chlamydia if: ? You are sexually active and are younger than 39 years of age. ? You are older than 39 years of age and your health care provider tells you that you are at risk for this type of infection. ? Your sexual activity has changed since you were last screened and you are at an increased risk for chlamydia or gonorrhea. Ask your health care provider if you are at risk.  If you do not have HIV, but are at risk, it may be recommended that you take a prescription medicine daily to prevent HIV infection. This is called pre-exposure prophylaxis (PrEP). You are considered at risk if: ? You are sexually active and do not regularly use condoms or know the HIV status of your partner(s). ? You take drugs by injection. ? You are sexually active with a partner  who has HIV. Talk with your health care provider about whether you are at high risk of being infected with HIV. If you choose to begin PrEP, you should first be tested for HIV. You should then be tested every 3 months for as long as you are taking PrEP. Pregnancy  If you are premenopausal and you may become pregnant, ask your health care provider about preconception counseling.  If you may become pregnant, take 400 to 800 micrograms (mcg) of folic acid every  day.  If you want to prevent pregnancy, talk to your health care provider about birth control (contraception). Osteoporosis and menopause  Osteoporosis is a disease in which the bones lose minerals and strength with aging. This can result in serious bone fractures. Your risk for osteoporosis can be identified using a bone density scan.  If you are 65 years of age or older, or if you are at risk for osteoporosis and fractures, ask your health care provider if you should be screened.  Ask your health care provider whether you should take a calcium or vitamin D supplement to lower your risk for osteoporosis.  Menopause may have certain physical symptoms and risks.  Hormone replacement therapy may reduce some of these symptoms and risks. Talk to your health care provider about whether hormone replacement therapy is right for you. Follow these instructions at home:  Schedule regular health, dental, and eye exams.  Stay current with your immunizations.  Do not use any tobacco products including cigarettes, chewing tobacco, or electronic cigarettes.  If you are pregnant, do not drink alcohol.  If you are breastfeeding, limit how much and how often you drink alcohol.  Limit alcohol intake to no more than 1 drink per day for nonpregnant women. One drink equals 12 ounces of beer, 5 ounces of wine, or 1 ounces of hard liquor.  Do not use street drugs.  Do not share needles.  Ask your health care provider for help if you need support  or information about quitting drugs.  Tell your health care provider if you often feel depressed.  Tell your health care provider if you have ever been abused or do not feel safe at home. This information is not intended to replace advice given to you by your health care provider. Make sure you discuss any questions you have with your health care provider. Document Released: 12/12/2010 Document Revised: 11/04/2015 Document Reviewed: 03/02/2015 Elsevier Interactive Patient Education  2019 [employer]   Breast Self-Awareness Breast self-awareness means:  Knowing how your breasts look.  Knowing how your breasts feel.  Checking your breasts every month for changes.  Telling your doctor if you notice a change in your breasts. Breast self-awareness allows you to notice a breast problem early while it is still small. How to do a breast self-exam One way to learn what is normal for your breasts and to check for changes is to do a breast self-exam. To do a breast self-exam: Look for Changes  1. Take off all the clothes above your waist. 2. Stand in front of a mirror in a room with good lighting. 3. Put your hands on your hips. 4. Push your hands down. 5. Look at your breasts and nipples in the mirror to see if one breast or nipple looks different than the other. Check to see if: ? The shape of one breast is different. ? The size of one breast is different. ? There are wrinkles, dips, and bumps in one breast and not the other. 6. Look at each breast for changes in your skin, such as: ? Redness. ? Scaly areas. 7. Look for changes in your nipples, such as: ? Liquid around the nipples. ? Bleeding. ? Dimpling. ? Redness. ? A change in where the nipples are. Feel for Changes 1. Lie on your back on the floor. 2. Feel each breast. To do this, follow these steps: ? Pick a breast to feel. ? Put the arm closest to that breast above your head. ?   Use your other arm to feel the nipple area  of your breast. Feel the area with the pads of your three middle fingers by making small circles with your fingers. For the first circle, press lightly. For the second circle, press harder. For the third circle, press even harder. ? Keep making circles with your fingers at the light, harder, and even harder pressures as you move down your breast. Stop when you feel your ribs. ? Move your fingers a little toward the center of your body. ? Start making circles with your fingers again, this time going up until you reach your collarbone. ? Keep making up and down circles until you reach your armpit. Remember to keep using the three pressures. ? Feel the other breast in the same way. 3. Sit or stand in the shower or tub. 4. With soapy water on your skin, feel each breast the same way you did in step 2, when you were lying on the floor. Write Down What You Find After doing the self-exam, write down:  What is normal for each breast.  Any changes you find in each breast.  When you last had your period.  How often should I check my breasts? Check your breasts every month. If you are breastfeeding, the best time to check them is after you feed your baby or after you use a breast pump. If you get periods, the best time to check your breasts is 5-7 days after your period is over. When should I see my doctor? See your doctor if you notice:  A change in shape or size of your breasts or nipples.  A change in the skin of your breast or nipples, such as red or scaly skin.  Unusual fluid coming from your nipples.  A lump or thick area that was not there before.  Pain in your breasts.  Anything that concerns you. This information is not intended to replace advice given to you by your health care provider. Make sure you discuss any questions you have with your health care provider. Document Released: 11/15/2007 Document Revised: 11/04/2015 Document Reviewed: 04/18/2015 Elsevier Interactive Patient  Education  2019 Elsevier Inc.  

## 2018-12-04 ENCOUNTER — Encounter: Payer: Self-pay | Admitting: Obstetrics and Gynecology

## 2018-12-04 ENCOUNTER — Other Ambulatory Visit: Payer: Self-pay

## 2018-12-04 ENCOUNTER — Ambulatory Visit (INDEPENDENT_AMBULATORY_CARE_PROVIDER_SITE_OTHER): Payer: 59 | Admitting: Obstetrics and Gynecology

## 2018-12-04 ENCOUNTER — Encounter: Payer: 59 | Admitting: Obstetrics and Gynecology

## 2018-12-04 VITALS — BP 94/66 | HR 83 | Ht 67.0 in | Wt 121.6 lb

## 2018-12-04 DIAGNOSIS — Z01419 Encounter for gynecological examination (general) (routine) without abnormal findings: Secondary | ICD-10-CM

## 2018-12-04 DIAGNOSIS — Z131 Encounter for screening for diabetes mellitus: Secondary | ICD-10-CM

## 2018-12-04 DIAGNOSIS — Z8639 Personal history of other endocrine, nutritional and metabolic disease: Secondary | ICD-10-CM

## 2018-12-04 NOTE — Progress Notes (Signed)
GYNECOLOGY ANNUAL PHYSICAL EXAM PROGRESS NOTE  Subjective:    Lindsay Owens is a 38 y.o. [id number] female who presents for an annual exam. The patient has no complaints today. The patient is sexually active.  The patient wears seatbelts: yes. The patient participates in regular exercise: yes. Has the patient ever been transfused or tattooed?: no. The patient reports that there is not domestic violence in her life.    Gynecologic History  Menarche age: 70 or 69 Patient's last menstrual period was 11/27/2018.  Menses regular, lasting 4-5 days, moderate flow. Contraception: condoms History of STI's: Denies Last Pap: 11/2016. Results were: abnormal.  Reports remote h/o abnormal pap smear x 1 followed by colposcopy.  All since then have been normal..   OB History  Gravida Para Term Preterm AB Living  4 4 4  0 0 4  SAB TAB Ectopic Multiple Live Births  0 0 0 0 4    # Outcome Date GA Lbr Len/2nd Weight Sex Delivery Anes PTL Lv  4 Term 05/02/16 [redacted]w[redacted]d/ 01:22 8 lb 12 oz (3.97 kg) F Vag-Spont EPI  LIV     Name: Dawkins,PENDINGBABY     Apgar1: 8  Apgar5: 9  3 Term    8 lb 1.8 oz (3.679 kg)  Vag-Spont   LIV  2 Term    7 lb 2.1 oz (3.234 kg)  Vag-Spont   LIV  1 Term    8 lb 6.4 oz (3.81 kg)  Vag-Spont   LIV    Past Medical History:  Diagnosis Date  . Acute mastitis of left breast   . Ulcerative colitis ([facility]    controlled    Past Surgical History:  Procedure Laterality Date  . NO PAST SURGERIES      Family History  Problem Relation Age of Onset  . Arthritis Mother   . Breast cancer Maternal Aunt   . Heart disease Maternal Grandmother   . Colon cancer Neg Hx   . Ovarian cancer Neg Hx   . Diabetes Neg Hx     Social History   Socioeconomic History  . Marital status: Married    Spouse name: Not on file  . Number of children: Not on file  . Years of education: Not on file  . Highest education level: Not on file  Occupational History  . Not on file  Social Needs  .  Financial resource strain: Not on file  . Food insecurity    Worry: Not on file    Inability: Not on file  . Transportation needs    Medical: Not on file    Non-medical: Not on file  Tobacco Use  . Smoking status: Never Smoker  . Smokeless tobacco: Never Used  Substance and Sexual Activity  . Alcohol use: Yes    Comment: rare  . Drug use: No  . Sexual activity: Yes    Birth control/protection: Condom  Lifestyle  . Physical activity    Days per week: 3 days    Minutes per session: 60 min  . Stress: Not on file  Relationships  . Social [place] phone: Not on file    Gets together: Not on file    Attends religious service: Not on file    Active member of club or organization: Not on file    Attends meetings of clubs or organizations: Not on file    Relationship status: Not on file  . Intimate partner violence  Fear of current or ex partner: Not on file    Emotionally abused: Not on file    Physically abused: Not on file    Forced sexual activity: Not on file  Other Topics Concern  . Not on file  Social History Narrative  . Not on file    Current Outpatient Medications on File Prior to Visit  Medication Sig Dispense Refill  . Multiple Vitamin (MULTI-VITAMINS) TABS Take by mouth.    . Probiotic Product (PROBIOTIC-10 PO) Take by mouth.     No current facility-administered medications on file prior to visit.     No Known Allergies    Review of Systems Constitutional: negative for chills, fatigue, fevers and sweats Eyes: negative for irritation, redness and visual disturbance Ears, nose, mouth, throat, and face: negative for hearing loss, nasal congestion, snoring and tinnitus Respiratory: negative for asthma, cough, sputum Cardiovascular: negative for chest pain, dyspnea, exertional chest pressure/discomfort, irregular heart beat, palpitations and syncope Gastrointestinal: negative for abdominal pain, change in bowel habits, nausea and vomiting  Genitourinary: negative for abnormal menstrual periods, genital lesions, sexual problems and vaginal discharge, dysuria and urinary incontinence Integument/breast: negative for breast lump, breast tenderness and nipple discharge Hematologic/lymphatic: negative for bleeding and easy bruising Musculoskeletal:negative for back pain and muscle weakness Neurological: negative for dizziness, headaches, vertigo and weakness Endocrine: negative for diabetic symptoms including polydipsia, polyuria and skin dryness Allergic/Immunologic: negative for hay fever and urticaria        Objective:  Blood pressure 94/66, pulse 83, height 5' 7"  (1.702 m), weight 121 lb 9.6 oz (55.2 kg), last menstrual period 11/27/2018. Body mass index is 19.05 kg/m.  General Appearance:    Alert, cooperative, no distress, appears stated age  Head:    Normocephalic, without obvious abnormality, atraumatic  Eyes:    PERRL, conjunctiva/corneas clear, EOM's intact, both eyes  Ears:    Normal external ear canals, both ears  Nose:   Nares normal, septum midline, mucosa normal, no drainage or sinus tenderness  Throat:   Lips, mucosa, and tongue normal; teeth and gums normal  Neck:   Supple, symmetrical, trachea midline, no adenopathy; thyroid: no enlargement/tenderness/nodules; no carotid bruit or JVD  Back:     Symmetric, no curvature, ROM normal, no CVA tenderness  Lungs:     Clear to auscultation bilaterally, respirations unlabored  Chest Wall:    No tenderness or deformity   Heart:    Regular rate and rhythm, S1 and S2 normal, no murmur, rub or gallop  Breast Exam:    No tenderness, masses, or nipple abnormality  Abdomen:     Soft, non-tender, bowel sounds active all four quadrants, no masses, no organomegaly.    Genitalia:    Pelvic:external genitalia normal, vagina without lesions, discharge, or tenderness, rectovaginal septum  normal. Cervix normal in appearance, no cervical motion tenderness, no adnexal masses or  tenderness.  Uterus normal size, shape, mobile, regular contours, nontender.  Rectal:    Normal external sphincter.  No hemorrhoids appreciated. Internal exam not done.   Extremities:   Extremities normal, atraumatic, no cyanosis or edema  Pulses:   2+ and symmetric all extremities  Skin:   Skin color, texture, turgor normal, no rashes or lesions  Lymph nodes:   Cervical, supraclavicular, and axillary nodes normal  Neurologic:   CNII-XII intact, normal strength, sensation and reflexes throughout   .  Labs:  Lab Results  Component Value Date   WBC 12.1 (H) 05/03/2016   HGB 12.4 05/03/2016  HCT 35.8 05/03/2016   MCV 94.5 05/03/2016   PLT 152 05/03/2016    No results found for: CREATININE, BUN, NA, K, CL, CO2   No results found for: ALT, AST, GGT, ALKPHOS, BILITOT    Lab Results  Component Value Date   TSH 1.180 11/29/2017    Lab Results  Component Value Date   CHOL 125 11/29/2017   HDL 45 11/29/2017   LDLCALC 66 11/29/2017   TRIG 69 11/29/2017   CHOLHDL 2.8 11/29/2017     Assessment:    Healthy female exam.   History of thyroiditis  Plan:     Blood tests: CBC with diff, Comprehensive metabolic panel, X6D, and TSH.  Breast self exam technique reviewed and patient encouraged to perform self-exam monthly. Contraception: condoms. Discussed healthy lifestyle modifications. Pap smear up to date.  Due in 1 year.  Follow up in 1 year.     Rubie Maid, MD [facility]

## 2018-12-05 LAB — COMPREHENSIVE METABOLIC PANEL
ALT: 15 IU/L (ref 0–32)
AST: 20 IU/L (ref 0–40)
Albumin/Globulin Ratio: 1.7 (ref 1.2–2.2)
Albumin: 4.5 g/dL (ref 3.8–4.8)
Alkaline Phosphatase: 55 IU/L (ref 39–117)
BUN/Creatinine Ratio: 13 (ref 9–23)
BUN: 11 mg/dL (ref 6–20)
Bilirubin Total: 0.5 mg/dL (ref 0.0–1.2)
CO2: 25 mmol/L (ref 20–29)
Calcium: 9.5 mg/dL (ref 8.7–10.2)
Chloride: 100 mmol/L (ref 96–106)
Creatinine, Ser: 0.88 mg/dL (ref 0.57–1.00)
GFR calc Af Amer: 96 mL/min/{1.73_m2} (ref >59)
GFR calc non Af Amer: 84 mL/min/{1.73_m2} (ref >59)
Globulin, Total: 2.7 g/dL (ref 1.5–4.5)
Glucose: 64 mg/dL — ABNORMAL LOW (ref 65–99)
Potassium: 4.4 mmol/L (ref 3.5–5.2)
Sodium: 141 mmol/L (ref 134–144)
Total Protein: 7.2 g/dL (ref 6.0–8.5)

## 2018-12-05 LAB — CBC
Hematocrit: 43 % (ref 34.0–46.6)
Hemoglobin: 14.5 g/dL (ref 11.1–15.9)
MCH: 32.4 pg (ref 26.6–33.0)
MCHC: 33.7 g/dL (ref 31.5–35.7)
MCV: 96 fL (ref 79–97)
Platelets: 225 10*3/uL (ref 150–450)
RBC: 4.48 x10E6/uL (ref 3.77–5.28)
RDW: 11.1 % — ABNORMAL LOW (ref 11.7–15.4)
WBC: 5.3 10*3/uL (ref 3.4–10.8)

## 2018-12-05 LAB — HEMOGLOBIN A1C
Est. average glucose Bld gHb Est-mCnc: 105 mg/dL
Hgb A1c MFr Bld: 5.3 % (ref 4.8–5.6)

## 2018-12-05 LAB — TSH: TSH: 1.05 u[IU]/mL (ref 0.450–4.500)

## 2019-12-01 ENCOUNTER — Telehealth: Payer: Self-pay | Admitting: Obstetrics and Gynecology

## 2019-12-01 NOTE — Telephone Encounter (Signed)
Patient called in wishing to reschedule her appointment for a annual physical with Dr. Marcelline Mates. Informed patient that if we rescheduled her appointment that I wouldn't be able to get her in until late August early September. Patient expressed concern because she had made her appointment a year in advance, informed patient I understood however now she is requesting to reschedule that appointment she made a year ago and that other patients had already done the same. Informed patient that I could put her on the wait list, patient verbalized understanding and we got her scheduled.

## 2019-12-09 ENCOUNTER — Encounter: Payer: 59 | Admitting: Obstetrics and Gynecology

## 2020-02-12 ENCOUNTER — Other Ambulatory Visit (HOSPITAL_COMMUNITY)
Admission: RE | Admit: 2020-02-12 | Discharge: 2020-02-12 | Disposition: A | Discharge status: Home / Self Care | Payer: 59 | Source: Ambulatory Visit | Attending: Obstetrics and Gynecology | Admitting: Obstetrics and Gynecology

## 2020-02-12 ENCOUNTER — Other Ambulatory Visit: Payer: Self-pay

## 2020-02-12 ENCOUNTER — Encounter: Payer: Self-pay | Admitting: Obstetrics and Gynecology

## 2020-02-12 ENCOUNTER — Ambulatory Visit (INDEPENDENT_AMBULATORY_CARE_PROVIDER_SITE_OTHER): Payer: 59 | Admitting: Obstetrics and Gynecology

## 2020-02-12 VITALS — BP 113/72 | HR 66 | Ht 67.0 in | Wt 120.9 lb

## 2020-02-12 DIAGNOSIS — Z8639 Personal history of other endocrine, nutritional and metabolic disease: Secondary | ICD-10-CM

## 2020-02-12 DIAGNOSIS — Z01419 Encounter for gynecological examination (general) (routine) without abnormal findings: Secondary | ICD-10-CM | POA: Diagnosis not present

## 2020-02-12 DIAGNOSIS — Z131 Encounter for screening for diabetes mellitus: Secondary | ICD-10-CM | POA: Diagnosis not present

## 2020-02-12 DIAGNOSIS — Z124 Encounter for screening for malignant neoplasm of cervix: Secondary | ICD-10-CM | POA: Diagnosis not present

## 2020-02-12 DIAGNOSIS — Z1231 Encounter for screening mammogram for malignant neoplasm of breast: Secondary | ICD-10-CM

## 2020-02-12 DIAGNOSIS — Z13220 Encounter for screening for lipoid disorders: Secondary | ICD-10-CM

## 2020-02-12 NOTE — Progress Notes (Signed)
Pt present for annual exam. Pt stated that she was doing well no problems.  

## 2020-02-12 NOTE — Patient Instructions (Signed)
Preventive Care 40-40 Years Old, Female °Preventive care refers to visits with your health care provider and lifestyle choices that can promote health and wellness. This includes: °· A yearly physical exam. This may also be called an annual well check. °· Regular dental visits and eye exams. °· Immunizations. °· Screening for certain conditions. °· Healthy lifestyle choices, such as eating a healthy diet, getting regular exercise, not using drugs or products that contain nicotine and tobacco, and limiting alcohol use. °What can I expect for my preventive care visit? °Physical exam °Your health care provider will check your: °· Height and weight. This may be used to calculate body mass index (BMI), which tells if you are at a healthy weight. °· Heart rate and blood pressure. °· Skin for abnormal spots. °Counseling °Your health care provider may ask you questions about your: °· Alcohol, tobacco, and drug use. °· Emotional well-being. °· Home and relationship well-being. °· Sexual activity. °· Eating habits. °· Work and work environment. °· Method of birth control. °· Menstrual cycle. °· Pregnancy history. °What immunizations do I need? ° °Influenza (flu) vaccine °· This is recommended every year. °Tetanus, diphtheria, and pertussis (Tdap) vaccine °· You may need a Td booster every 10 years. °Varicella (chickenpox) vaccine °· You may need this if you have not been vaccinated. °Zoster (shingles) vaccine °· You may need this after age 60. °Measles, mumps, and rubella (MMR) vaccine °· You may need at least one dose of MMR if you were born in 1957 or later. You may also need a second dose. °Pneumococcal conjugate (PCV13) vaccine °· You may need this if you have certain conditions and were not previously vaccinated. °Pneumococcal polysaccharide (PPSV23) vaccine °· You may need one or two doses if you smoke cigarettes or if you have certain conditions. °Meningococcal conjugate (MenACWY) vaccine °· You may need this if you  have certain conditions. °Hepatitis A vaccine °· You may need this if you have certain conditions or if you travel or work in places where you may be exposed to hepatitis A. °Hepatitis B vaccine °· You may need this if you have certain conditions or if you travel or work in places where you may be exposed to hepatitis B. °Haemophilus influenzae type b (Hib) vaccine °· You may need this if you have certain conditions. °Human papillomavirus (HPV) vaccine °· If recommended by your health care provider, you may need three doses over 6 months. °You may receive vaccines as individual doses or as more than one vaccine together in one shot (combination vaccines). Talk with your health care provider about the risks and benefits of combination vaccines. °What tests do I need? °Blood tests °· Lipid and cholesterol levels. These may be checked every 5 years, or more frequently if you are over 50 years old. °· Hepatitis C test. °· Hepatitis B test. °Screening °· Lung cancer screening. You may have this screening every year starting at age 55 if you have a 30-pack-year history of smoking and currently smoke or have quit within the past 15 years. °· Colorectal cancer screening. All adults should have this screening starting at age 50 and continuing until age 75. Your health care provider may recommend screening at age 45 if you are at increased risk. You will have tests every 1-10 years, depending on your results and the type of screening test. °· Diabetes screening. This is done by checking your blood sugar (glucose) after you have not eaten for a while (fasting). You may have this   done every 1-3 years. °· Mammogram. This may be done every 1-2 years. Talk with your health care provider about when you should start having regular mammograms. This may depend on whether you have a family history of breast cancer. °· BRCA-related cancer screening. This may be done if you have a family history of breast, ovarian, tubal, or peritoneal  cancers. °· Pelvic exam and Pap test. This may be done every 3 years starting at age 21. Starting at age 30, this may be done every 5 years if you have a Pap test in combination with an HPV test. °Other tests °· Sexually transmitted disease (STD) testing. °· Bone density scan. This is done to screen for osteoporosis. You may have this scan if you are at high risk for osteoporosis. °Follow these instructions at home: °Eating and drinking °· Eat a diet that includes fresh fruits and vegetables, whole grains, lean protein, and low-fat dairy. °· Take vitamin and mineral supplements as recommended by your health care provider. °· Do not drink alcohol if: °? Your health care provider tells you not to drink. °? You are pregnant, may be pregnant, or are planning to become pregnant. °· If you drink alcohol: °? Limit how much you have to 0-1 drink a day. °? Be aware of how much alcohol is in your drink. In the [place] one drink equals one 12 oz bottle of beer (355 mL), one 5 oz glass of wine (148 mL), or one 1½ oz glass of hard liquor (44 mL). °Lifestyle °· Take daily care of your teeth and gums. °· Stay active. Exercise for at least 30 minutes on 5 or more days each week. °· Do not use any products that contain nicotine or tobacco, such as cigarettes, e-cigarettes, and chewing tobacco. If you need help quitting, ask your health care provider. °· If you are sexually active, practice safe sex. Use a condom or other form of birth control (contraception) in order to prevent pregnancy and STIs (sexually transmitted infections). °· If told by your health care provider, take low-dose aspirin daily starting at age 50. °What's next? °· Visit your health care provider once a year for a well check visit. °· Ask your health care provider how often you should have your eyes and teeth checked. °· Stay up to date on all vaccines. °This information is not intended to replace advice given to you by your health care provider. Make sure you  discuss any questions you have with your health care provider. °Document Revised: 02/07/2018 Document Reviewed: 02/07/2018 °Elsevier Patient Education © 2020 [employer] °Breast Self-Awareness °Breast self-awareness is knowing how your breasts look and feel. Doing breast self-awareness is important. It allows you to catch a breast problem early while it is still small and can be treated. All women should do breast self-awareness, including women who have had breast implants. Tell your doctor if you notice a change in your breasts. °What you need: °· A mirror. °· A well-lit room. °How to do a breast self-exam °A breast self-exam is one way to learn what is normal for your breasts and to check for changes. To do a breast self-exam: °Look for changes ° °1. Take off all the clothes above your waist. °2. Stand in front of a mirror in a room with good lighting. °3. Put your hands on your hips. °4. Push your hands down. °5. Look at your breasts and nipples in the mirror to see if one breast or nipple looks different from the   other. Check to see if: °? The shape of one breast is different. °? The size of one breast is different. °? There are wrinkles, dips, and bumps in one breast and not the other. °6. Look at each breast for changes in the skin, such as: °? Redness. °? Scaly areas. °7. Look for changes in your nipples, such as: °? Liquid around the nipples. °? Bleeding. °? Dimpling. °? Redness. °? A change in where the nipples are. °Feel for changes ° °1. Lie on your back on the floor. °2. Feel each breast. To do this, follow these steps: °? Pick a breast to feel. °? Put the arm closest to that breast above your head. °? Use your other arm to feel the nipple area of your breast. Feel the area with the pads of your three middle fingers by making small circles with your fingers. For the first circle, press lightly. For the second circle, press harder. For the third circle, press even harder. °? Keep making circles with  your fingers at the different pressures as you move down your breast. Stop when you feel your ribs. °? Move your fingers a little toward the center of your body. °? Start making circles with your fingers again, this time going up until you reach your collarbone. °? Keep making up-and-down circles until you reach your armpit. Remember to keep using the three pressures. °? Feel the other breast in the same way. °3. Sit or stand in the tub or shower. °4. With soapy water on your skin, feel each breast the same way you did in step 2 when you were lying on the floor. °Write down what you find °Writing down what you find can help you remember what to tell your doctor. Write down: °· What is normal for each breast. °· Any changes you find in each breast, including: °? The kind of changes you find. °? Whether you have pain. °? Size and location of any lumps. °· When you last had your menstrual period. °General tips °· Check your breasts every month. °· If you are breastfeeding, the best time to check your breasts is after you feed your baby or after you use a breast pump. °· If you get menstrual periods, the best time to check your breasts is 5-7 days after your menstrual period is over. °· With time, you will become comfortable with the self-exam, and you will begin to know if there are changes in your breasts. °Contact a doctor if you: °· See a change in the shape or size of your breasts or nipples. °· See a change in the skin of your breast or nipples, such as red or scaly skin. °· Have fluid coming from your nipples that is not normal. °· Find a lump or thick area that was not there before. °· Have pain in your breasts. °· Have any concerns about your breast health. °Summary °· Breast self-awareness includes looking for changes in your breasts, as well as feeling for changes within your breasts. °· Breast self-awareness should be done in front of a mirror in a well-lit room. °· You should check your breasts every month.  If you get menstrual periods, the best time to check your breasts is 5-7 days after your menstrual period is over. °· Let your doctor know of any changes you see in your breasts, including changes in size, changes on the skin, pain or tenderness, or fluid from your nipples that is not normal. °This information is not   intended to replace advice given to you by your health care provider. Make sure you discuss any questions you have with your health care provider. °Document Revised: 01/15/2018 Document Reviewed: 01/15/2018 °Elsevier Patient Education © 2020 [employer] Inc[employer] ° °

## 2020-02-12 NOTE — Progress Notes (Signed)
GYNECOLOGY ANNUAL PHYSICAL EXAM PROGRESS NOTE  Subjective:    Lindsay Owens is a 39 y.o. [phone] female who presents for an annual exam. The patient has no complaints today. The patient is sexually active.  The patient wears seatbelts: yes. The patient participates in regular exercise: yes.    Gynecologic History  Menarche age: 56 or 7 Patient's last menstrual period was 02/12/2020.  Menses regular, lasting 4-5 days, moderate flow. History of STI's: Denies Last Pap: 11/2016. Results were: normal.  Reports remote h/o abnormal pap smear x 1 followed by colposcopy.  All since then have been normal.. Contraception: condoms.    Upstream - 02/12/20 0810      Pregnancy Intention Screening   Does the patient want to become pregnant in the next year? Ok Either Way    Does the patient's partner want to become pregnant in the next year? Unsure    Would the patient like to discuss contraceptive options today? No      Contraception Wrap Up   Current Method Female Condom           Upstream - 02/12/20 0810      Pregnancy Intention Screening   Does the patient want to become pregnant in the next year? Ok Either Way    Does the patient's partner want to become pregnant in the next year? Unsure    Would the patient like to discuss contraceptive options today? No      Contraception Wrap Up   Current Method Female Condom          The pregnancy intention screening data noted above was reviewed. Potential methods of contraception were discussed. The patient elected to proceed with Female Condom.    OB History  Gravida Para Term Preterm AB Living  4 4 4  0 0 4  SAB TAB Ectopic Multiple Live Births  0 0 0 0 4    # Outcome Date GA Lbr Len/2nd Weight Sex Delivery Anes PTL Lv  4 Term 05/02/16 [redacted]w[redacted]d/ 01:22 8 lb 12 oz (3.97 kg) F Vag-Spont EPI  LIV     Name: Laningham,PENDINGBABY     Apgar1: 8  Apgar5: 9  3 Term    8 lb 1.8 oz (3.679 kg)  Vag-Spont   LIV  2 Term    7 lb 2.1 oz (3.234 kg)   Vag-Spont   LIV  1 Term    8 lb 6.4 oz (3.81 kg)  Vag-Spont   LIV    Past Medical History:  Diagnosis Date  . Acute mastitis of left breast   . Ulcerative colitis ([place]    controlled    Past Surgical History:  Procedure Laterality Date  . NO PAST SURGERIES      Family History  Problem Relation Age of Onset  . Arthritis Mother   . Breast cancer Maternal Aunt   . Heart disease Maternal Grandmother   . Colon cancer Neg Hx   . Ovarian cancer Neg Hx   . Diabetes Neg Hx     Social History   Socioeconomic History  . Marital status: Married    Spouse name: Not on file  . Number of children: Not on file  . Years of education: Not on file  . Highest education level: Not on file  Occupational History  . Not on file  Tobacco Use  . Smoking status: Never Smoker  . Smokeless tobacco: Never Used  Vaping Use  . Vaping Use: Never used  Substance  and Sexual Activity  . Alcohol use: Yes    Comment: rare  . Drug use: No  . Sexual activity: Yes    Birth control/protection: Condom  Other Topics Concern  . Not on file  Social History Narrative  . Not on file   Social Determinants of Health   Financial Resource Strain:   . Difficulty of Paying Living Expenses: Not on file  Food Insecurity:   . Worried About Charity fundraiser in the Last Year: Not on file  . Ran Out of Food in the Last Year: Not on file  Transportation Needs:   . Lack of Transportation (Medical): Not on file  . Lack of Transportation (Non-Medical): Not on file  Physical Activity:   . Days of Exercise per Week: Not on file  . Minutes of Exercise per Session: Not on file  Stress:   . Feeling of Stress : Not on file  Social Connections:   . Frequency of Communication with Friends and Family: Not on file  . Frequency of Social Gatherings with Friends and Family: Not on file  . Attends Religious Services: Not on file  . Active Member of Clubs or Organizations: Not on file  . Attends Archivist  Meetings: Not on file  . Marital Status: Not on file  Intimate Partner Violence:   . Fear of Current or Ex-Partner: Not on file  . Emotionally Abused: Not on file  . Physically Abused: Not on file  . Sexually Abused: Not on file    Current Outpatient Medications on File Prior to Visit  Medication Sig Dispense Refill  . Multiple Vitamin (MULTI-VITAMINS) TABS Take by mouth.    . Probiotic Product (PROBIOTIC-10 PO) Take by mouth.     No current facility-administered medications on file prior to visit.    No Known Allergies    Review of Systems Constitutional: negative for chills, fatigue, fevers and sweats Eyes: negative for irritation, redness and visual disturbance Ears, nose, mouth, throat, and face: negative for hearing loss, nasal congestion, snoring and tinnitus Respiratory: negative for asthma, cough, sputum Cardiovascular: negative for chest pain, dyspnea, exertional chest pressure/discomfort, irregular heart beat, palpitations and syncope Gastrointestinal: negative for abdominal pain, change in bowel habits, nausea and vomiting Genitourinary: negative for abnormal menstrual periods, genital lesions, sexual problems and vaginal discharge, dysuria and urinary incontinence Integument/breast: negative for breast lump, breast tenderness and nipple discharge Hematologic/lymphatic: negative for bleeding and easy bruising Musculoskeletal:negative for back pain and muscle weakness Neurological: negative for dizziness, headaches, vertigo and weakness Endocrine: negative for diabetic symptoms including polydipsia, polyuria and skin dryness Allergic/Immunologic: negative for hay fever and urticaria        Objective:  Blood pressure 113/72, pulse 66, height 5' 7"  (1.702 m), weight 120 lb 14.4 oz (54.8 kg), last menstrual period 02/12/2020. Body mass index is 18.94 kg/m.  General Appearance:    Alert, cooperative, no distress, appears stated age  Head:    Normocephalic, without  obvious abnormality, atraumatic  Eyes:    PERRL, conjunctiva/corneas clear, EOM's intact, both eyes  Ears:    Normal external ear canals, both ears  Nose:   Nares normal, septum midline, mucosa normal, no drainage or sinus tenderness  Throat:   Lips, mucosa, and tongue normal; teeth and gums normal  Neck:   Supple, symmetrical, trachea midline, no adenopathy; thyroid: no enlargement/tenderness/nodules; no carotid bruit or JVD  Back:     Symmetric, no curvature, ROM normal, no CVA tenderness  Lungs:  Clear to auscultation bilaterally, respirations unlabored  Chest Wall:    No tenderness or deformity   Heart:    Regular rate and rhythm, S1 and S2 normal, no murmur, rub or gallop  Breast Exam:    No tenderness, masses, or nipple abnormality  Abdomen:     Soft, non-tender, bowel sounds active all four quadrants, no masses, no organomegaly.    Genitalia:    Pelvic:external genitalia normal, vagina without lesions, discharge, or tenderness, rectovaginal septum  normal. Cervix normal in appearance, no cervical motion tenderness, no adnexal masses or tenderness.  Uterus normal size, shape, mobile, regular contours, nontender.  Rectal:    Normal external sphincter.  No hemorrhoids appreciated. Internal exam not done.   Extremities:   Extremities normal, atraumatic, no cyanosis or edema  Pulses:   2+ and symmetric all extremities  Skin:   Skin color, texture, turgor normal, no rashes or lesions  Lymph nodes:   Cervical, supraclavicular, and axillary nodes normal  Neurologic:   CNII-XII intact, normal strength, sensation and reflexes throughout   .  Labs:  Lab Results  Component Value Date   WBC 5.3 12/04/2018   HGB 14.5 12/04/2018   HCT 43.0 12/04/2018   MCV 96 12/04/2018   PLT 225 12/04/2018    Lab Results  Component Value Date   CREATININE 0.88 12/04/2018   BUN 11 12/04/2018   NA 141 12/04/2018   K 4.4 12/04/2018   CL 100 12/04/2018   CO2 25 12/04/2018     Lab Results    Component Value Date   ALT 15 12/04/2018   AST 20 12/04/2018   ALKPHOS 55 12/04/2018   BILITOT 0.5 12/04/2018      Lab Results  Component Value Date   TSH 1.050 12/04/2018    Lab Results  Component Value Date   CHOL 125 11/29/2017   HDL 45 11/29/2017   LDLCALC 66 11/29/2017   TRIG 69 11/29/2017   CHOLHDL 2.8 11/29/2017     Assessment:    Healthy female exam.   History of thyroiditis  Plan:     Blood tests: CBC with diff, Comprehensive metabolic panel, P2T, and TSH.  Breast self exam technique reviewed and patient encouraged to perform self-exam monthly. Contraception: condoms. Discussed healthy lifestyle modifications. Pap smear performed today.  COVID vaccination series completed Therapist, music).  Follow up in 1 year.    Rubie Maid, MD [facility]

## 2020-02-13 LAB — LIPID PANEL
Chol/HDL Ratio: 2.6 ratio (ref 0.0–4.4)
Cholesterol, Total: 145 mg/dL (ref 100–199)
HDL: 55 mg/dL (ref >39)
LDL Chol Calc (NIH): 77 mg/dL (ref 0–99)
Triglycerides: 66 mg/dL (ref 0–149)
VLDL Cholesterol Cal: 13 mg/dL (ref 5–40)

## 2020-02-13 LAB — COMPREHENSIVE METABOLIC PANEL
ALT: 18 IU/L (ref 0–32)
AST: 23 IU/L (ref 0–40)
Albumin/Globulin Ratio: 1.7 (ref 1.2–2.2)
Albumin: 4.8 g/dL (ref 3.8–4.8)
Alkaline Phosphatase: 64 IU/L (ref 48–121)
BUN/Creatinine Ratio: 15 (ref 9–23)
BUN: 12 mg/dL (ref 6–20)
Bilirubin Total: 0.6 mg/dL (ref 0.0–1.2)
CO2: 28 mmol/L (ref 20–29)
Calcium: 9.8 mg/dL (ref 8.7–10.2)
Chloride: 101 mmol/L (ref 96–106)
Creatinine, Ser: 0.81 mg/dL (ref 0.57–1.00)
GFR calc Af Amer: 106 mL/min/{1.73_m2} (ref >59)
GFR calc non Af Amer: 92 mL/min/{1.73_m2} (ref >59)
Globulin, Total: 2.8 g/dL (ref 1.5–4.5)
Glucose: 63 mg/dL — ABNORMAL LOW (ref 65–99)
Potassium: 4.5 mmol/L (ref 3.5–5.2)
Sodium: 141 mmol/L (ref 134–144)
Total Protein: 7.6 g/dL (ref 6.0–8.5)

## 2020-02-13 LAB — HEMOGLOBIN A1C
Est. average glucose Bld gHb Est-mCnc: 111 mg/dL
Hgb A1c MFr Bld: 5.5 % (ref 4.8–5.6)

## 2020-02-13 LAB — CBC
Hematocrit: 44.6 % (ref 34.0–46.6)
Hemoglobin: 15 g/dL (ref 11.1–15.9)
MCH: 31.9 pg (ref 26.6–33.0)
MCHC: 33.6 g/dL (ref 31.5–35.7)
MCV: 95 fL (ref 79–97)
Platelets: 229 10*3/uL (ref 150–450)
RBC: 4.7 x10E6/uL (ref 3.77–5.28)
RDW: 11.1 % — ABNORMAL LOW (ref 11.7–15.4)
WBC: 5.1 10*3/uL (ref 3.4–10.8)

## 2020-02-13 LAB — THYROID PANEL WITH TSH
Free Thyroxine Index: 2.2 (ref 1.2–4.9)
T3 Uptake Ratio: 27 % (ref 24–39)
T4, Total: 8 ug/dL (ref 4.5–12.0)
TSH: 1.28 u[IU]/mL (ref 0.450–4.500)

## 2020-02-20 LAB — CYTOLOGY - PAP
Comment: NEGATIVE
Diagnosis: NEGATIVE
High risk HPV: NEGATIVE

## 2020-05-13 ENCOUNTER — Encounter: Payer: Self-pay | Admitting: Obstetrics and Gynecology

## 2020-05-13 ENCOUNTER — Other Ambulatory Visit: Payer: Self-pay

## 2020-05-13 ENCOUNTER — Ambulatory Visit (INDEPENDENT_AMBULATORY_CARE_PROVIDER_SITE_OTHER): Payer: 59 | Admitting: Obstetrics and Gynecology

## 2020-05-13 VITALS — BP 114/72 | HR 75 | Ht 67.0 in | Wt 124.1 lb

## 2020-05-13 DIAGNOSIS — Z3201 Encounter for pregnancy test, result positive: Secondary | ICD-10-CM | POA: Diagnosis not present

## 2020-05-13 DIAGNOSIS — O09521 Supervision of elderly multigravida, first trimester: Secondary | ICD-10-CM

## 2020-05-13 DIAGNOSIS — N926 Irregular menstruation, unspecified: Secondary | ICD-10-CM | POA: Diagnosis not present

## 2020-05-13 LAB — POCT URINE PREGNANCY: Preg Test, Ur: POSITIVE — AB

## 2020-05-13 NOTE — Progress Notes (Signed)
    GYNECOLOGY CLINIC PROGRESS NOTE  Subjective:    Lindsay Owens is a 39 y.o. female who presents for evaluation of amenorrhea. She believes she could be pregnant. Pregnancy is desired. Sexual Activity: single partner, contraception: none (was previously using condoms). Current symptoms also include: positive home pregnancy test. Last period was normal.  Patient's last menstrual period was 03/31/2020.   The following portions of the patient's history were reviewed and updated as appropriate: allergies, current medications, past family history, past medical history, past social history, past surgical history and problem list.  Review of Systems Pertinent items noted in HPI and remainder of comprehensive ROS otherwise negative.       Objective:    BP 114/72   Pulse 75   Ht 5' 7"  (1.702 m)   Wt 124 lb 1.6 oz (56.3 kg)   LMP 03/31/2020   BMI 19.44 kg/m  General: alert, no distress and no acute distress    Lab Review Urine HCG: positive    Assessment:   1. Missed menses   2. Positive pregnancy test   3. Advanced maternal age in multigravida, first trimester    Plan:   - Pregnancy Test: Positive: EDC: 01/06/2020, with EGA 6.3 weeks. Briefly discussed pre-natal care options. Pregnancy, Childbirth and the Newborn book given. Encouraged well-balanced diet, plenty of rest when needed, pre-natal vitamins daily and walking for exercise. Discussed self-help for nausea, avoiding OTC medications until consulting provider or pharmacist, other than Tylenol as needed, minimal caffeine (1-2 cups daily) and avoiding alcohol. She will schedule her initial OB visit in the next month with her PCP or OB provider. Feel free to call with any questions.  - Advanced maternal age, recommend genetic screening early in pregnancy.  - Patient expresses concerns about delivering another large infant.  She had last birth of 8 lb 12 oz. Discussed that we can monitor growth during pregnancy.    Lindsay Maid,  MD [facility]

## 2020-05-13 NOTE — Progress Notes (Signed)
PT is present today for confirmation of pregnancy. Pt LMP 03/31/2020. UPT done today results were positive. Pt stated that she is doing well no complaints.

## 2020-05-13 NOTE — Patient Instructions (Addendum)
WHAT OB PATIENTS CAN EXPECT   Confirmation of pregnancy and ultrasound ordered if medically indicated-[redacted] weeks gestation  New OB (NOB) intake with nurse and New OB (NOB) labs- [redacted] weeks gestation  New OB (NOB) physical examination with provider- 11/[redacted] weeks gestation  Flu vaccine-[redacted] weeks gestation  Anatomy scan-[redacted] weeks gestation  Glucose tolerance test, blood work to test for anemia, T-dap vaccine-[redacted] weeks gestation  Vaginal swabs/cultures-STD/Group B strep-[redacted] weeks gestation  Appointments every 4 weeks until 28 weeks  Every 2 weeks from 28 weeks until 36 weeks  Weekly visits from 47 weeks until delivery   First Trimester of Pregnancy  The first trimester of pregnancy is from week 1 until the end of week 13 (months 1 through 3). During this time, your baby will begin to develop inside you. At 6-8 weeks, the eyes and face are formed, and the heartbeat can be seen on ultrasound. At the end of 12 weeks, all the baby's organs are formed. Prenatal care is all the medical care you receive before the birth of your baby. Make sure you get good prenatal care and follow all of your doctor's instructions. Follow these instructions at home: Medicines  Take over-the-counter and prescription medicines only as told by your doctor. Some medicines are safe and some medicines are not safe during pregnancy.  Take a prenatal vitamin that contains at least 600 micrograms (mcg) of folic acid.  If you have trouble pooping (constipation), take medicine that will make your stool soft (stool softener) if your doctor approves. Eating and drinking   Eat regular, healthy meals.  Your doctor will tell you the amount of weight gain that is right for you.  Avoid raw meat and uncooked cheese.  If you feel sick to your stomach (nauseous) or throw up (vomit): ? Eat 4 or 5 small meals a day instead of 3 large meals. ? Try eating a few soda crackers. ? Drink liquids between meals instead of during  meals.  To prevent constipation: ? Eat foods that are high in fiber, like fresh fruits and vegetables, whole grains, and beans. ? Drink enough fluids to keep your pee (urine) clear or pale yellow. Activity  Exercise only as told by your doctor. Stop exercising if you have cramps or pain in your lower belly (abdomen) or low back.  Do not exercise if it is too hot, too humid, or if you are in a place of great height (high altitude).  Try to avoid standing for long periods of time. Move your legs often if you must stand in one place for a long time.  Avoid heavy lifting.  Wear low-heeled shoes. Sit and stand up straight.  You can have sex unless your doctor tells you not to. Relieving pain and discomfort  Wear a good support bra if your breasts are sore.  Take warm water baths (sitz baths) to soothe pain or discomfort caused by hemorrhoids. Use hemorrhoid cream if your doctor says it is okay.  Rest with your legs raised if you have leg cramps or low back pain.  If you have puffy, bulging veins (varicose veins) in your legs: ? Wear support hose or compression stockings as told by your doctor. ? Raise (elevate) your feet for 15 minutes, 3-4 times a day. ? Limit salt in your food. Prenatal care  Schedule your prenatal visits by the twelfth week of pregnancy.  Write down your questions. Take them to your prenatal visits.  Keep all your prenatal visits as told by  your doctor. This is important. Safety  Wear your seat belt at all times when driving.  Make a list of emergency phone numbers. The list should include numbers for family, friends, the hospital, and police and fire departments. General instructions  Ask your doctor for a referral to a local prenatal class. Begin classes no later than at the start of month 6 of your pregnancy.  Ask for help if you need counseling or if you need help with nutrition. Your doctor can give you advice or tell you where to go for help.  Do  not use hot tubs, steam rooms, or saunas.  Do not douche or use tampons or scented sanitary pads.  Do not cross your legs for long periods of time.  Avoid all herbs and alcohol. Avoid drugs that are not approved by your doctor.  Do not use any tobacco products, including cigarettes, chewing tobacco, and electronic cigarettes. If you need help quitting, ask your doctor. You may get counseling or other support to help you quit.  Avoid cat litter boxes and soil used by cats. These carry germs that can cause birth defects in the baby and can cause a loss of your baby (miscarriage) or stillbirth.  Visit your dentist. At home, brush your teeth with a soft toothbrush. Be gentle when you floss. Contact a doctor if:  You are dizzy.  You have mild cramps or pressure in your lower belly.  You have a nagging pain in your belly area.  You continue to feel sick to your stomach, you throw up, or you have watery poop (diarrhea).  You have a bad smelling fluid coming from your vagina.  You have pain when you pee (urinate).  You have increased puffiness (swelling) in your face, hands, legs, or ankles. Get help right away if:  You have a fever.  You are leaking fluid from your vagina.  You have spotting or bleeding from your vagina.  You have very bad belly cramping or pain.  You gain or lose weight rapidly.  You throw up blood. It may look like coffee grounds.  You are around people who have [place] measles, fifth disease, or chickenpox.  You have a very bad headache.  You have shortness of breath.  You have any kind of trauma, such as from a fall or a car accident. Summary  The first trimester of pregnancy is from week 1 until the end of week 13 (months 1 through 3).  To take care of yourself and your unborn baby, you will need to eat healthy meals, take medicines only if your doctor tells you to do so, and do activities that are safe for you and your baby.  Keep all follow-up  visits as told by your doctor. This is important as your doctor will have to ensure that your baby is healthy and growing well. This information is not intended to replace advice given to you by your health care provider. Make sure you discuss any questions you have with your health care provider. Document Revised: 09/19/2018 Document Reviewed: 06/06/2016 Elsevier Patient Education  [place]. Morning Sickness  Morning sickness is when you feel sick to your stomach (nauseous) during pregnancy. You may feel sick to your stomach and throw up (vomit). You may feel sick in the morning, but you can feel this way at any time of day. Some women feel very sick to their stomach and cannot stop throwing up (hyperemesis gravidarum). Follow these instructions at home: Medicines  Take over-the-counter and prescription medicines only as told by your doctor. Do not take any medicines until you talk with your doctor about them first.  Taking multivitamins before getting pregnant can stop or lessen the harshness of morning sickness. Eating and drinking  Eat dry toast or crackers before getting out of bed.  Eat 5 or 6 small meals a day.  Eat dry and bland foods like rice and baked potatoes.  Do not eat greasy, fatty, or spicy foods.  Have someone cook for you if the smell of food causes you to feel sick or throw up.  If you feel sick to your stomach after taking prenatal vitamins, take them at night or with a snack.  Eat protein when you need a snack. Nuts, yogurt, and cheese are good choices.  Drink fluids throughout the day.  Try ginger ale made with real ginger, ginger tea made from fresh grated ginger, or ginger candies. General instructions  Do not use any products that have nicotine or tobacco in them, such as cigarettes and e-cigarettes. If you need help quitting, ask your doctor.  Use an air purifier to keep the air in your house free of smells.  Get lots of fresh air.  Try to  avoid smells that make you feel sick.  Try: ? Wearing a bracelet that is used for seasickness (acupressure wristband). ? Going to a doctor who puts thin needles into certain body points (acupuncture) to improve how you feel. Contact a doctor if:  You need medicine to feel better.  You feel dizzy or light-headed.  You are losing weight. Get help right away if:  You feel very sick to your stomach and cannot stop throwing up.  You pass out (faint).  You have very bad pain in your belly. Summary  Morning sickness is when you feel sick to your stomach (nauseous) during pregnancy.  You may feel sick in the morning, but you can feel this way at any time of day.  Making some changes to what you eat may help your symptoms go away. This information is not intended to replace advice given to you by your health care provider. Make sure you discuss any questions you have with your health care provider. Document Revised: 05/11/2017 Document Reviewed: 06/29/2016 Elsevier Patient Education  2020 [employer]. Commonly Asked Questions During Pregnancy  Cats: A parasite can be excreted in cat feces.  To avoid exposure you need to have another person empty the little box.  If you must empty the litter box you will need to wear gloves.  Wash your hands after handling your cat.  This parasite can also be found in raw or undercooked meat so this should also be avoided.  Colds, Sore Throats, Flu: Please check your medication sheet to see what you can take for symptoms.  If your symptoms are unrelieved by these medications please call the office.  Dental Work: Most any dental work Investment banker, corporate recommends is permitted.  X-rays should only be taken during the first trimester if absolutely necessary.  Your abdomen should be shielded with a lead apron during all x-rays.  Please notify your provider prior to receiving any x-rays.  Novocaine is fine; gas is not recommended.  If your dentist requires a note  from [place] prior to dental work please call the office and we will provide one for you.  Exercise: Exercise is an important part of staying healthy during your pregnancy.  You may continue most exercises you were accustomed  to prior to pregnancy.  Later in your pregnancy you will most likely notice you have difficulty with activities requiring balance like riding a bicycle.  It is important that you listen to your body and avoid activities that put you at a higher risk of falling.  Adequate rest and staying well hydrated are a must!  If you have questions about the safety of specific activities ask your provider.    Exposure to Children with illness: Try to avoid obvious exposure; report any symptoms to [place] when noted,  If you have chicken pos, red measles or mumps, you should be immune to these diseases.   Please do not take any vaccines while pregnant unless you have checked with your OB provider.  Fetal Movement: After 28 weeks we recommend you do "kick counts" twice daily.  Lie or sit down in a calm quiet environment and count your baby movements "kicks".  You should feel your baby at least 10 times per hour.  If you have not felt 10 kicks within the first hour get up, walk around and have something sweet to eat or drink then repeat for an additional hour.  If count remains less than 10 per hour notify your provider.  Fumigating: Follow your pest control agent's advice as to how long to stay out of your home.  Ventilate the area well before re-entering.  Hemorrhoids:   Most over-the-counter preparations can be used during pregnancy.  Check your medication to see what is safe to use.  It is important to use a stool softener or fiber in your diet and to drink lots of liquids.  If hemorrhoids seem to be getting worse please call the office.   Hot Tubs:  Hot tubs Jacuzzis and saunas are not recommended while pregnant.  These increase your internal body temperature and should be avoided.  Intercourse:   Sexual intercourse is safe during pregnancy as long as you are comfortable, unless otherwise advised by your provider.  Spotting may occur after intercourse; report any bright red bleeding that is heavier than spotting.  Labor:  If you know that you are in labor, please go to the hospital.  If you are unsure, please call the office and let us help you decide what to do.  Lifting, straining, etc:  If your job requires heavy lifting or straining please check with your provider for any limitations.  Generally, you should not lift items heavier than that you can lift simply with your hands and arms (no back muscles)  Painting:  Paint fumes do not harm your pregnancy, but may make you ill and should be avoided if possible.  Latex or water based paints have less odor than oils.  Use adequate ventilation while painting.  Permanents & Hair Color:  Chemicals in hair dyes are not recommended as they cause increase hair dryness which can increase hair loss during pregnancy.  " Highlighting" and permanents are allowed.  Dye may be absorbed differently and permanents may not hold as well during pregnancy.  Sunbathing:  Use a sunscreen, as skin burns easily during pregnancy.  Drink plenty of fluids; avoid over heating.  Tanning Beds:  Because their possible side effects are still unknown, tanning beds are not recommended.  Ultrasound Scans:  Routine ultrasounds are performed at approximately 20 weeks.  You will be able to see your baby's general anatomy an if you would like to know the gender this can usually be determined as well.  If it is questionable when you  conceived you may also receive an ultrasound early in your pregnancy for dating purposes.  Otherwise ultrasound exams are not routinely performed unless there is a medical necessity.  Although you can request a scan we ask that you pay for it when conducted because insurance does not cover " patient request" scans.  Work: If your pregnancy proceeds  without complications you may work until your due date, unless your physician or employer advises otherwise.  Round Ligament Pain/Pelvic Discomfort:  Sharp, shooting pains not associated with bleeding are fairly common, usually occurring in the second trimester of pregnancy.  They tend to be worse when standing up or when you remain standing for long periods of time.  These are the result of pressure of certain pelvic ligaments called "round ligaments".  Rest, Tylenol and heat seem to be the most effective relief.  As the womb and fetus grow, they rise out of the pelvis and the discomfort improves.  Please notify the office if your pain seems different than that described.  It may represent a more serious condition.  Common Medications Safe in Pregnancy  Acne:      Constipation:  Benzoyl Peroxide     Colace  Clindamycin      Dulcolax Suppository  Topica Erythromycin     Fibercon  Salicylic Acid      Metamucil         Miralax AVOID:        Senakot   Accutane    Cough:  Retin-A       Cough Drops  Tetracycline      Phenergan w/ Codeine if Rx  Minocycline      Robitussin (Plain & DM)  Antibiotics:     Crabs/Lice:  Ceclor       RID  Cephalosporins    AVOID:  E-Mycins      Kwell  Keflex  Macrobid/Macrodantin   Diarrhea:  Penicillin      Kao-Pectate  Zithromax      Imodium AD         PUSH FLUIDS AVOID:       Cipro     Fever:  Tetracycline      Tylenol (Regular or Extra  Minocycline       Strength)  Levaquin      Extra Strength-Do not          Exceed 8 tabs/24 hrs Caffeine:        <224m/day (equiv. To 1 cup of coffee or  approx. 3 12 oz sodas)         Gas: Cold/Hayfever:       Gas-X  Benadryl      Mylicon  Claritin       Phazyme  **Claritin-D        Chlor-Trimeton    Headaches:  Dimetapp      ASA-Free Excedrin  Drixoral-Non-Drowsy     Cold Compress  Mucinex (Guaifenasin)     Tylenol (Regular or Extra  Sudafed/Sudafed-12 Hour     Strength)  **Sudafed PE Pseudoephedrine   Tylenol  Cold & Sinus     Vicks Vapor Rub  Zyrtec  **AVOID if Problems With Blood Pressure         Heartburn: Avoid lying down for at least 1 hour after meals  Aciphex      Maalox     Rash:  Milk of Magnesia     Benadryl    Mylanta       1% Hydrocortisone Cream  Pepcid  Pepcid  Complete   Sleep Aids:  Prevacid      Ambien   Prilosec       Benadryl  Rolaids       Chamomile Tea  Tums (Limit 4/day)     Unisom         Tylenol PM         Warm milk-add vanilla or  Hemorrhoids:       Sugar for taste  Anusol/Anusol H.C.  (RX: Analapram 2.5%)  Sugar Substitutes:  Hydrocortisone OTC     Ok in moderation  Preparation H      Tucks        Vaseline lotion applied to tissue with wiping    Herpes:     Throat:  Acyclovir      Oragel  Famvir  Valtrex     Vaccines:         Flu Shot Leg Cramps:       *Gardasil  Benadryl      Hepatitis A         Hepatitis B Nasal Spray:       Pneumovax  Saline Nasal Spray     Polio Booster         Tetanus Nausea:       Tuberculosis test or PPD  Vitamin B6 25 mg TID   AVOID:    Dramamine      *Gardasil  Emetrol       Live Poliovirus  Ginger Root 250 mg QID    MMR (measles, mumps &  High Complex Carbs @ Bedtime    rebella)  Sea Bands-Accupressure    Varicella (Chickenpox)  Unisom 1/2 tab TID     *No known complications           If received before Pain:         Known pregnancy;   Darvocet       Resume series after  Lortab        Delivery  Percocet    Yeast:   Tramadol      Femstat  Tylenol 3      Gyne-lotrimin  Ultram       Monistat  Vicodin           MISC:         All Sunscreens           Hair Coloring/highlights          Insect Repellant's          (Including DEET)         Mystic Tans

## 2020-05-21 ENCOUNTER — Ambulatory Visit (INDEPENDENT_AMBULATORY_CARE_PROVIDER_SITE_OTHER): Payer: 59 | Admitting: Obstetrics and Gynecology

## 2020-05-21 ENCOUNTER — Telehealth: Payer: Self-pay

## 2020-05-21 ENCOUNTER — Encounter: Payer: Self-pay | Admitting: Obstetrics and Gynecology

## 2020-05-21 ENCOUNTER — Other Ambulatory Visit: Payer: Self-pay

## 2020-05-21 VITALS — BP 124/77 | HR 72 | Ht 67.0 in | Wt 122.5 lb

## 2020-05-21 DIAGNOSIS — O209 Hemorrhage in early pregnancy, unspecified: Secondary | ICD-10-CM | POA: Diagnosis not present

## 2020-05-21 NOTE — Telephone Encounter (Signed)
Spoke to pt concerning her call and mychart message. Pt was placed on the schedule for 11:30am today.

## 2020-05-21 NOTE — Patient Instructions (Signed)
Vaginal Bleeding During Pregnancy, First Trimester  A small amount of bleeding (spotting) from the vagina is common during early pregnancy. Sometimes the bleeding is normal and does not cause problems. At other times, though, bleeding may be a sign of something serious. Tell your doctor about any bleeding from your vagina right away. Follow these instructions at home: Activity  Follow your doctor's instructions about how active you can be.  If needed, make plans for someone to help with your normal activities.  Do not have sex or orgasms until your doctor says that this is safe. General instructions  Take over-the-counter and prescription medicines only as told by your doctor.  Watch your condition for any changes.  Write down: ? The number of pads you use each day. ? How often you change pads. ? How soaked (saturated) your pads are.  Do not use tampons.  Do not douche.  If you pass any tissue from your vagina, save it to show to your doctor.  Keep all follow-up visits as told by your doctor. This is important. Contact a doctor if:  You have vaginal bleeding at any time while you are pregnant.  You have cramps.  You have a fever. Get help right away if:  You have very bad cramps in your back or belly (abdomen).  You pass large clots or a lot of tissue from your vagina.  Your bleeding gets worse.  You feel light-headed.  You feel weak.  You pass out (faint).  You have chills.  You are leaking fluid from your vagina.  You have a gush of fluid from your vagina. Summary  Sometimes vaginal bleeding during pregnancy is normal and does not cause problems. At other times, bleeding may be a sign of something serious.  Tell your doctor about any bleeding from your vagina right away.  Follow your doctor's instructions about how active you can be. You may need someone to help you with your normal activities. This information is not intended to replace advice given to  you by your health care provider. Make sure you discuss any questions you have with your health care provider. Document Revised: 09/17/2018 Document Reviewed: 08/30/2016 Elsevier Patient Education  [place].

## 2020-05-21 NOTE — Progress Notes (Signed)
   GYNECOLOGY CLINIC PROGRESS NOTE Subjective:    Lindsay Owens is a 39 y.o. [phone] female who is currently at 7.[redacted] weeks gestation by LMP of 03/31/2020. Lindsay Owens reports an onset  bleeding since yesterday. Reports that she woke up with small amount of blood on her underwear yesterday.  Bleeding was noted off and on throughout the day  But was light. Again had some bleeding this morning, it was mostly spotting and more dark red/brown.  She denies any abdominal pain or pelvic cramping. Has not had intercourse since Sunday (5 days ago). She is in no acute distress. Ectopic risks: none. Denies urinary symptoms.   Pregnancy testing: qual HCG positive on 05/13/2020. Pregnancy imaging: scheduled for 05/27/2020. Blood type: O positive. Other lab results: none.  The following portions of the patient's history were reviewed and updated as appropriate: allergies, current medications, past family history, past medical history, past social history, past surgical history and problem list.  Review of Systems Pertinent items noted in HPI and remainder of comprehensive ROS otherwise negative.   Objective:     BP 124/77   Pulse 72   Ht 5' 7"  (1.702 m)   Wt 122 lb 8 oz (55.6 kg)   BMI 19.19 kg/m   Last Menstrual Period: 03/31/2020 (approximate).    General:   alert and no distress  Abdomen: soft, non-tender, without masses or organomegaly  Pelvic:                  Vulva: normal              Vagina:  normal mucosa, scant dark brown discharge               Cervix: no lesions and closed               Uterus: normal size, non-tender, normal shape and consistency              Adnexa: normal adnexa and no mass, fullness, tenderness      Extremities:  Non-tender, no edema.        Neurologic:  Grossly normal   Imaging Limited office ultrasound: SIUP visualized, gestational sac seen with fetal pole, measured 5.6 weeks. Fetal flicker seen but unable to measure FHR with dopplers.    Assessment:    Bleeding  in early pregnancy   Plan:   - Discussed differential diagnosis with patient, including implantation bleeding, subchorionic hemorrhage, threatened miscarriage.  - Follow-up appointment with ultrasound and NOB visit on 05/27/2020. - Warning signs discussed: to call for increased bleeding, abdominal, light headedness, or if she has any concerns.   Lindsay Maid, MD [facility]

## 2020-05-21 NOTE — Telephone Encounter (Signed)
Patient called in stating that she was continuing to have vaginal bleeding with no abdominal cramping or pain. Patient denies any nausea, vomiting or diarrhea. I informed the patient that she could reply to the [place] conversation she was already having with her providers nurse as they have a better chance of getting a quicker response that way, patient stated "You can't just send a message while you have me on the phone." I told the patient that it wasn't an issue for me to send a message back that I was just offering another option to get this information to her provider. Informed patient I would send a message back to her provider but to allow 24-48 hours for the provider or nurse to be back in touch as they are in the office with patients today. Lindsay Owens then stated "I have to wait 24-48 hours, I'm going to have to talk to Dr. Marcelline Mates about that because I need to be seen today." Informed patient " I am sorry this is our protocols for our office, that they may not have the chance to look at their messages in between patients". Patient still was not happy with that, told patient I was sending a message now and that if she became in any more distress that she could also go to the ED.  Could you please advise?

## 2020-05-21 NOTE — Progress Notes (Signed)
Pt present for noticing spotting and light bleeding in early pregnancy. Pt stated that she noticed the blood in her underwear yesterday morning and noticed spotting throughout the day and at night.

## 2020-05-24 NOTE — Progress Notes (Deleted)
Lindsay Owens presents for NOB nurse interview visit. LMP 03/31/20. Pregnancy confirmation done 05/13/20 Dr Marcelline Mates. Dating and viability scan done 05/27/20  G-5 P-4 0 0 4. Pregnancy education material explained and given. ___ cats in the home. NOB labs ordered. HIV labs and Drug screen were explained optional and she did not decline. Drug screen ordered/declined. PNV encouraged. Genetic screening options discussed. Genetic testing: Ordered/Declined/Unsure.  Pt may discuss with provider. Pt. To follow up with provider in __ weeks for NOB physical.  All questions answered. FMLA paper signed. Financial policy reviewed.

## 2020-05-26 ENCOUNTER — Ambulatory Visit (INDEPENDENT_AMBULATORY_CARE_PROVIDER_SITE_OTHER): Payer: 59 | Admitting: Obstetrics and Gynecology

## 2020-05-26 ENCOUNTER — Other Ambulatory Visit: Payer: Self-pay

## 2020-05-26 ENCOUNTER — Ambulatory Visit (INDEPENDENT_AMBULATORY_CARE_PROVIDER_SITE_OTHER): Payer: 59

## 2020-05-26 ENCOUNTER — Other Ambulatory Visit: Payer: Self-pay | Admitting: Obstetrics and Gynecology

## 2020-05-26 ENCOUNTER — Encounter: Payer: Self-pay | Admitting: Obstetrics and Gynecology

## 2020-05-26 ENCOUNTER — Ambulatory Visit: Payer: 59 | Admitting: Obstetrics and Gynecology

## 2020-05-26 VITALS — BP 110/74 | HR 62 | Ht 67.0 in | Wt 122.4 lb

## 2020-05-26 DIAGNOSIS — R58 Hemorrhage, not elsewhere classified: Secondary | ICD-10-CM

## 2020-05-26 DIAGNOSIS — Z789 Other specified health status: Secondary | ICD-10-CM

## 2020-05-26 DIAGNOSIS — O200 Threatened abortion: Secondary | ICD-10-CM

## 2020-05-26 NOTE — Progress Notes (Signed)
   GYNECOLOGY CLINIC PROGRESS NOTE Subjective:    Lindsay Owens is a 39 y.o. [phone] female. Jayleene reports increased vaginal bleeding since this morning. She had been having lighter vaginal bleeding since last week. She denies pelvic cramping.   Bedside ultrasound at that time confirmed viable IUP at ~ 5.[redacted] weeks gestation (not consistent with LMP of 03/31/20).  She is not in acute distress. Ectopic risks: none.   Pregnancy testing: qual HCG positive on 05/13/2020. Blood type: O positive.(reviewed from prior pregnancy) Other lab results: none.  The following portions of the patient's history were reviewed and updated as appropriate: allergies, current medications, past family history, past medical history, past social history, past surgical history and problem list.  Review of Systems Pertinent items noted in HPI and remainder of comprehensive ROS otherwise negative.   Objective:     BP 110/74 (Patient Position: Sitting)   Pulse 62   Ht 5' 7"  (1.702 m)   Wt 122 lb 7 oz (55.5 kg)   BMI 19.18 kg/m   Imaging  Narrative & Impression Patient Name: Lindsay Owens DOB: [birth year] MRN: [id number] ULTRASOUND REPORT  Location: [facility] Date of Service: 05/26/2020   Indications:Threatened AB Findings:  Lindsay Owens intrauterine pregnancy is visualized with a CRL consistent with 25w6dgestation, giving an (U/S) EDD of 01/20/2021.   FHR: 55 BPM CRL measurement: 2.6 mm Yolk sac is visualized and appears normal and early anatomy is normal. Amnion: not visualized   Right Ovary is normal in appearance. Left Ovary is normal appearance. Corpus luteal cyst:  is not visualized Survey of the adnexa demonstrates no adnexal masses. There is no free peritoneal fluid in the cul de sac.  Impression: 1. 566w6diable [place] Intrauterine pregnancy by U/S. 2. (U/S) EDD is consistent with Clinically established EDD of 01/20/2021.  Recommendations: 1.Clinical correlation with the  patient's History and Physical Exam.  Jenine M. AlAlbertine Grates   RDMS   Assessment:   Threatened abortion Advanced maternal age  Plan:   - Discussed ultrasound findings with patient.  Based on no further fetal growth since last bedside sono (still measuring ~ 5.6 weeks) low fetal heart rate, and increase in bleeding is likely that current pregnancy will result in miscarriage. Patient has questions regarding possible cause of miscarriage, if miscarriage is related to her current age, and risks of future miscarriage.  Attempted to answer all questions to patient's satisfaction.  Patient tearful but noted understanding.  - Patient notes that she had plans to travel this weekend for the holidays.  Advised that bleeding may increase slightly, but should be no heavier than needing to change 1 pad per hour.  Warning signs discussed: to call for significantly increased bleeding, severe abdominal pain, light headedness, or if she has any concerns. To follow up at nearest Urgent Care or Emergency Room if this occurs.  - Patient to f/u once she returns from out of town to repeat ultrasound to confirm no retained POC's (in ~ 2 weeks).   A total of 15 minutes were spent face-to-face with the patient during this encounter and over half of that time dealt with counseling and coordination of care.  ChRubie MaidMD [facility]

## 2020-05-26 NOTE — Progress Notes (Signed)
Pt present due to vaginal bleeding since last Thursday. Pt states it has gotten heavier and bright red.

## 2020-05-26 NOTE — Progress Notes (Unsigned)
Pt c/o vaginal bleeding since last Thursday. States it has gotten heavier, bright red bleeding.

## 2020-05-26 NOTE — Telephone Encounter (Signed)
Pt is calling in today she hasn't been called about the vaginal bleeding she is experiencing. Please advise & please see last messages

## 2020-05-27 ENCOUNTER — Other Ambulatory Visit: Payer: 59

## 2020-05-27 NOTE — Patient Instructions (Signed)
Threatened Miscarriage  A threatened miscarriage occurs when a woman has vaginal bleeding during the first 20 weeks of pregnancy but the pregnancy has not ended. If you have vaginal bleeding during this time, your health care provider will do tests to make sure you are still pregnant. If the tests show that you are still pregnant and that the developing baby (fetus) inside your uterus is still growing, your condition is considered a threatened miscarriage. A threatened miscarriage does not mean your pregnancy will end, but it does increase the risk of losing your pregnancy (complete miscarriage). What are the causes? The cause of this condition is usually not known. For women who go on to have a complete miscarriage, the most common cause is an abnormal number of chromosomes in the developing baby. Chromosomes are the structures inside cells that hold all of a person's genetic material. What increases the risk? The following lifestyle factors may increase your risk of a miscarriage in early pregnancy:  Smoking.  Drinking excessive amounts of alcohol or caffeine.  Recreational drug use. The following preexisting health conditions may increase your risk of a miscarriage in early pregnancy:  Polycystic ovary syndrome.  Uterine fibroids.  Infections.  Diabetes mellitus. What are the signs or symptoms? Symptoms of this condition include:  Vaginal bleeding.  Mild abdominal pain or cramps. How is this diagnosed? If you have bleeding with or without abdominal pain before 20 weeks of pregnancy, your health care provider will do tests to check whether you are still pregnant. These will include:  Ultrasound. This test uses sound waves to create images of the inside of your uterus. This allows your health care provider to look at your developing baby and other structures, such as your placenta.  Pelvic exam. This is an internal exam of your vagina and cervix.  Measurement of your baby's heart  rate.  Laboratory tests such as blood tests, urine tests, or swabs for infection You may be diagnosed with a threatened miscarriage if:  Ultrasound testing shows that you are still pregnant.  Your baby's heart rate is strong.  A pelvic exam shows that the opening between your uterus and your vagina (cervix) is closed.  Blood tests confirm that you are still pregnant. How is this treated? No treatments have been shown to prevent a threatened miscarriage from going on to a complete miscarriage. However, the right home care is important. Follow these instructions at home:  Get plenty of rest.  Do not have sex or use tampons if you have vaginal bleeding.  Do not douche.  Do not smoke or use recreational drugs.  Do not drink alcohol.  Avoid caffeine.  Keep all follow-up prenatal visits as told by your health care provider. This is important. Contact a health care provider if:  You have light vaginal bleeding or spotting while pregnant.  You have abdominal pain or cramping.  You have a fever. Get help right away if:  You have heavy vaginal bleeding.  You have blood clots coming from your vagina.  You pass tissue from your vagina.  You leak fluid, or you have a gush of fluid from your vagina.  You have severe low back pain or abdominal cramps.  You have fever, chills, and severe abdominal pain. Summary  A threatened miscarriage occurs when a woman has vaginal bleeding during the first 20 weeks of pregnancy but the pregnancy has not ended.  The cause of a threatened miscarriage is usually not known.  Symptoms of this condition may  include vaginal bleeding and mild abdominal pain or cramps.  No treatments have been shown to prevent a threatened miscarriage from going on to a complete miscarriage.  Keep all follow-up prenatal visits as told by your health care provider. This is important. This information is not intended to replace advice given to you by your health  care provider. Make sure you discuss any questions you have with your health care provider. Document Revised: 07/05/2017 Document Reviewed: 08/25/2016 Elsevier Patient Education  [employer].

## 2020-06-03 ENCOUNTER — Telehealth: Payer: Self-pay

## 2020-06-03 NOTE — Telephone Encounter (Signed)
Pt called no answer LM via VM that I was calling to check on her due to having a threatened miscarriage.

## 2020-06-09 ENCOUNTER — Encounter: Payer: Self-pay | Admitting: Obstetrics and Gynecology

## 2020-06-09 ENCOUNTER — Ambulatory Visit (INDEPENDENT_AMBULATORY_CARE_PROVIDER_SITE_OTHER): Payer: 59

## 2020-06-09 ENCOUNTER — Ambulatory Visit (INDEPENDENT_AMBULATORY_CARE_PROVIDER_SITE_OTHER): Payer: 59 | Admitting: Obstetrics and Gynecology

## 2020-06-09 ENCOUNTER — Other Ambulatory Visit: Payer: Self-pay

## 2020-06-09 VITALS — BP 103/71 | HR 67 | Ht 67.0 in | Wt 125.5 lb

## 2020-06-09 DIAGNOSIS — O039 Complete or unspecified spontaneous abortion without complication: Secondary | ICD-10-CM | POA: Diagnosis not present

## 2020-06-09 DIAGNOSIS — Z789 Other specified health status: Secondary | ICD-10-CM | POA: Diagnosis not present

## 2020-06-09 NOTE — Progress Notes (Signed)
Pt present for follow up from ultrasound and miscarriage. Pt sated that she was doing well no problems.

## 2020-06-09 NOTE — Progress Notes (Signed)
    GYNECOLOGY PROGRESS NOTE  Subjective:    Patient ID: Lindsay Owens, female    DOB: 11-02-1980, 39 y.o.   MRN: [id number]  HPI  Patient is a 39 y.o. [id number] female who presents for 2 week f/u after spontaneous first trimester miscarriage.  She reports bleeding for several days after diagnosis, followed by an episode of very heavy bleeding and passage of clots and tissue and pelvic cramping.  After this, the bleeding lightened over the next several days and finally stopped. No complaints today.  Notes that emotionally she feels like she is doing ok. Is still contemplating wither or not she will try again.   The following portions of the patient's history were reviewed and updated as appropriate: allergies, current medications, past family history, past medical history, past social history, past surgical history and problem list.  Review of Systems Pertinent items noted in HPI and remainder of comprehensive ROS otherwise negative.   Objective:   Blood pressure 103/71, pulse 67, height 5' 7"  (1.702 m), weight 125 lb 8 oz (56.9 kg). General appearance: alert and no distress Remainder of exam deferred.    Imaging:  Patient Name: Lindsay Owens DOB: 27-Aug-1980 MRN: [id number] ULTRASOUND REPORT  Location: [facility] Date of Service: 06/09/2020     Indications: S/p miscarriage Findings:  The uterus is retroverted and measures 10.0 x 4.5 x 5.8 cm.. Echo texture is homogenous without evidence of focal masses.  The Endometrium measures 11 mm.  Right Ovary measures 3.3 x 2.4 x 2.2 cm. It is normal in appearance. Left Ovary measures 2.8 x 1.6 x 2.0 cm. It is normal in appearance. Survey of the adnexa demonstrates no adnexal masses. There is no free fluid in the cul de sac.  Impression: 1. No retain products of conception seen within the uterus.  Recommendations: 1.Clinical correlation with the patient's History and Physical Exam.   Jenine M. Albertine Grates     RDMS  Assessment:   Spontaneous abortion  Plan:   - Reviewed ultrasound results with patient. No evidence of retained products. Miscarriage completed.  - Discussed that if patient desires to attempt pregnancy again, would encourage continuation of PNV, and folic acid. Would recommend genetic testing as early as possible in first trimester due to advanced materna age. Should wait at least 1 month for a normal cycle before attempting to conceive again.  If pregnancy is not desired, can further discuss contraception at a future visit.    Rubie Maid, MD [facility]

## 2020-09-08 ENCOUNTER — Ambulatory Visit (INDEPENDENT_AMBULATORY_CARE_PROVIDER_SITE_OTHER): Payer: No Typology Code available for payment source | Admitting: Obstetrics and Gynecology

## 2020-09-08 ENCOUNTER — Other Ambulatory Visit: Payer: Self-pay

## 2020-09-08 ENCOUNTER — Encounter: Payer: Self-pay | Admitting: Obstetrics and Gynecology

## 2020-09-08 VITALS — BP 129/80 | HR 86 | Ht 67.0 in | Wt 125.8 lb

## 2020-09-08 DIAGNOSIS — O09299 Supervision of pregnancy with other poor reproductive or obstetric history, unspecified trimester: Secondary | ICD-10-CM | POA: Diagnosis not present

## 2020-09-08 DIAGNOSIS — O200 Threatened abortion: Secondary | ICD-10-CM | POA: Diagnosis not present

## 2020-09-08 NOTE — Progress Notes (Signed)
   Subjective:    Lindsay Owens is a 40 y.o. [id number] female. Lindsay Owens was initially scheduled for a confirmation of pregnancy, however notes that she has been bleeding fairly heavy since Friday. Was using approximately 2 pads per hour. Also reports passing a "jelly"-like material. She is not in acute distress. Ectopic risks: none.  Of note, patient had recent miscarriage in December.   Of note, patient reports feeling somewhat dizzy and faint last month while on the treadmill. Was unsure if she might be anemic as she bled fairly heavy with her miscarriage. Began taking a PNV with iron.   Cycle length: regular. Pregnancy testing: at home. Pregnancy imaging: has not had. Blood type: O positive. Other lab results: none.  The following portions of the patient's history were reviewed and updated as appropriate: allergies, current medications, past family history, past medical history, past social history, past surgical history and problem list.  Review of Systems Pertinent items noted in HPI and remainder of comprehensive ROS otherwise negative.   Objective:     BP 129/80   Pulse 86   Ht 5' 7"  (1.702 m)   Wt 125 lb 12.8 oz (57.1 kg)   BMI 19.70 kg/m  General:   alert and no distress  Abdomen: soft, non-tender, without masses or organomegaly  Pelvic:                  Vulva: normal              Vagina:  Small amount of dark red blood in vaginal vault. No discharge.                Cervix: no cervical motion tenderness and cervix closed               Uterus: normal size              Adnexa: normal adnexa and no mass, fullness, tenderness   Imaging Limited office ultrasound: No IUP visualized. Slightly thickened endometrium present.    Assessment:   Bleeding in early pregnancy History of miscarriage   Plan:   - Quantitative hCG today. Based on levels, can repeat in 48 hours, however based on exam, limited ultrasound with no IUP visualized and complaints of heavy bleeding, likely is  experiencing another miscarriage. Should be 6.[redacted] weeks gestation based on LMP. - History of miscarriages, patient worried that this is due to her age and is considering no longer attempting pregnancy. Advised that we could check progesterone levels today along with her hCG. Also can check AMH in a few weeks to assess egg quality. If still normal, would encourage use of supplemental progesterone and aspirin supplement to prevent future miscarriages if future attempts occur.  - Bleeding precautions given, however patient notes that bleeding has begun slowing down over the past 1-2 days.    Rubie Maid, MD [facility]

## 2020-09-08 NOTE — Progress Notes (Signed)
GYN-Pt present for follow up due to possible miscarriage. Pt sent a mychart message stating that she was having a lot of vaginal bleeding after positive pregnancy test.

## 2020-09-11 ENCOUNTER — Encounter: Payer: Self-pay | Admitting: Obstetrics and Gynecology

## 2020-09-11 LAB — CBC
Hematocrit: 42.1 % (ref 34.0–46.6)
Hemoglobin: 14.3 g/dL (ref 11.1–15.9)
MCH: 32.2 pg (ref 26.6–33.0)
MCHC: 34 g/dL (ref 31.5–35.7)
MCV: 95 fL (ref 79–97)
Platelets: 227 10*3/uL (ref 150–450)
RBC: 4.44 x10E6/uL (ref 3.77–5.28)
RDW: 10.7 % — ABNORMAL LOW (ref 11.7–15.4)
WBC: 5.9 10*3/uL (ref 3.4–10.8)

## 2020-09-11 LAB — HUMAN CHORIONIC GONADOTROPIN(HCG),B-SUBUNIT,QUANTITATIVE): HCG, Beta Chain, Quant, S: 635 m[IU]/mL

## 2020-09-11 LAB — PROGESTERONE: Progesterone: 0.3 ng/mL

## 2020-09-11 NOTE — Patient Instructions (Signed)
Miscarriage A miscarriage is the loss of a pregnancy before the 20th week of pregnancy. Sometimes, a pregnancy ends before a woman knows that she is pregnant. If you lose a pregnancy, talk with your doctor about:  Questions you have about the loss of your baby.  How to work through your grief.  Plans for future pregnancy. What are the causes? Many times, the cause of this condition is not known. What increases the risk? These things may make a pregnant woman more likely to lose a pregnancy: Certain health conditions  Conditions that affect hormones, such as: ? Thyroid disease. ? Polycystic ovary syndrome.  Diabetes.  A disease that causes the body's disease-fighting system to attack itself by mistake.  Infections.  Bleeding problems.  Being very overweight. Lifestyle factors  Using products that have tobacco or nicotine in them.  Being around tobacco smoke.  Having alcohol.  Having a lot of caffeine.  Using drugs. Problems with reproductive organs or parts  Having a cervix that opens and thins before your due date. The cervix is the lowest part of your womb.  Having Asherman syndrome, which leads to: ? Scars in the womb. ? The womb being abnormal in shape.  Growths (fibroids) in the womb.  Problems in the body that are present at birth.  Infection of the cervix or womb. Personal or health history  Injury.  Having lost a pregnancy before.  Being younger than age 36 or older than age 51.  Being around a harmful substance, such as radiation.  Having lead or other heavy metals in: ? Things you eat or drink. ? The air around you.  Using certain medicines. What are the signs or symptoms?  Blood or spots of blood coming from the vagina. You may also have cramps or pain.  Pain or cramps in the belly or low back.  Fluid or tissue coming out of the vagina. How is this treated? Sometimes, treatment is not needed. If you need treatment, you may be  treated with:  A procedure to open the cervix more and take tissue out of the womb.  Medicines. You may get a shot of medicine called Rho(D) immune globulin. Follow these instructions at home: Medicines  Take over-the-counter and prescription medicines only as told by your doctor.  If you were prescribed antibiotic medicine, take it as told by your doctor. Do not stop taking it even if you start to feel better. Activity  Rest as told by your doctor. Ask your doctor what activities are safe for you.  Have someone help you at home during this time. General instructions  Watch how much tissue comes out of the vagina.  Watch the size of any blood clots that come out of the vagina.  Do not have sex or douche until your doctor says it is okay.  Do not put things, such as tampons, in your vagina until your doctor says it is okay.  To help you and your partner with grieving: ? Talk with your doctor. ? See a Social worker.  When you are ready, talk with your doctor about: ? Things to do for your health. ? How you can be healthy if you get pregnant again.  Keep all follow-up visits.   Where to find more information  The [employer] of [employer]st[employer]ians and Gynecologists: acog.org  [place] Department of Health and [employer], systems of Women's Health: EverydayCosmetics.no Contact a doctor if:  You have a fever or chills.  There is bad-smelling fluid coming  from the vagina.  You have more bleeding.  Tissue or clots of blood come out of your vagina. Get help right away if:  You have very bad cramps or pain in your back or belly.  You soak more than 2 large pads in an hour for more than 2 hours.  You get light-headed or weak.  You faint.  You feel sad, and you have sad thoughts a lot of the time.  You think about hurting yourself. Get help right awayif you feel like you may hurt yourself or others, or have thoughts about taking your own life. Go to your nearest  emergency room or:  Call your local emergency services (911 in the [place]  Call the [place] at 732-725-5514. This is open 24 hours a day.  Text the Crisis Text Line at (863) 003-2540. Summary  A miscarriage is the loss of a pregnancy before the 20th week of pregnancy. Sometimes, a pregnancy ends before a woman knows that she is pregnant.  Follow instructions from your doctor about medicines and activity.  To help you and your partner with grieving, talk with your doctor or a counselor.  Keep all follow-up visits. This information is not intended to replace advice given to you by your health care provider. Make sure you discuss any questions you have with your health care provider. Document Revised: 11/28/2019 Document Reviewed: 11/28/2019 Elsevier Patient Education  [place].

## 2020-09-20 ENCOUNTER — Ambulatory Visit: Payer: 59 | Admitting: Family Medicine

## 2020-11-02 ENCOUNTER — Ambulatory Visit
Admission: RE | Admit: 2020-11-02 | Discharge: 2020-11-02 | Disposition: A | Discharge status: Home / Self Care | Payer: No Typology Code available for payment source | Source: Ambulatory Visit | Attending: Obstetrics and Gynecology | Admitting: Obstetrics and Gynecology

## 2020-11-02 ENCOUNTER — Other Ambulatory Visit: Payer: Self-pay

## 2020-11-02 DIAGNOSIS — Z1231 Encounter for screening mammogram for malignant neoplasm of breast: Secondary | ICD-10-CM | POA: Diagnosis present

## 2021-02-15 NOTE — Progress Notes (Signed)
GYNECOLOGY ANNUAL PHYSICAL EXAM PROGRESS NOTE  Subjective:   Lindsay Owens is a 40 y.o. [phone] female who presents for an annual exam. The patient has no complaints today. The patient is sexually active.  The patient wears seatbelts: yes. The patient participates in regular exercise: yes.      Gynecologic History  Menarche age: 9 or 70 Patient's last menstrual period was unknown.  Menses regular, lasting 4-5 days, moderate flow. History of STI's: Denies Last Pap: 02/12/2020. Results were: normal.  Reports remote h/o abnormal pap smear x 1 followed by colposcopy.  All since then have been normal.. Contraception: condoms.  Mammogram: 10/2020.  Results were: normal.    OB History  Gravida Para Term Preterm AB Living  5 4 4  0 1 4  SAB IAB Ectopic Multiple Live Births  1 0 0 0 4    # Outcome Date GA Lbr Len/2nd Weight Sex Delivery Anes PTL Lv  5 SAB 2021          4 Term 05/02/16 [redacted]w[redacted]d/ 01:22 8 lb 12 oz (3.97 kg) F Vag-Spont EPI  LIV     Name: Creswell,PENDINGBABY     Apgar1: 8  Apgar5: 9  3 Term 11/20/12   8 lb 1.8 oz (3.679 kg) F Vag-Spont   LIV  2 Term 02/03/09   7 lb 2.1 oz (3.234 kg) M Vag-Spont   LIV  1 Term 05/08/07   8 lb 6.4 oz (3.81 kg) F Vag-Spont   LIV     Upstream - 02/16/21 0817       Pregnancy Intention Screening   Does the patient want to become pregnant in the next year? No    Does the patient's partner want to become pregnant in the next year? No    Would the patient like to discuss contraceptive options today? No      Contraception Wrap Up   Current Method Female Condom    End Method Female Condom    Contraception Counseling Provided No            The pregnancy intention screening data noted above was reviewed. Potential methods of contraception were discussed. The patient elected to proceed with Female Condom.   Past Medical History:  Diagnosis Date   Acute mastitis of left breast    Miscarriage    Ulcerative colitis ([place]    controlled    Past  Surgical History:  Procedure Laterality Date   NO PAST SURGERIES      Family History  Problem Relation Age of Onset   Arthritis Mother    Breast cancer Maternal Aunt    Heart disease Maternal Grandmother    Colon cancer Neg Hx    Ovarian cancer Neg Hx    Diabetes Neg Hx     Social History   Socioeconomic History   Marital status: Married    Spouse name: Not on file   Number of children: Not on file   Years of education: Not on file   Highest education level: Not on file  Occupational History   Not on file  Tobacco Use   Smoking status: Never   Smokeless tobacco: Never  Vaping Use   Vaping Use: Never used  Substance and Sexual Activity   Alcohol use: Not Currently    Comment: rare   Drug use: No   Sexual activity: Not Currently  Other Topics Concern   Not on file  Social History Narrative   Not on file  Social Determinants of Health   Financial Resource Strain: Not on file  Food Insecurity: Not on file  Transportation Needs: Not on file  Physical Activity: Not on file  Stress: Not on file  Social Connections: Not on file  Intimate Partner Violence: Not on file    Current Outpatient Medications on File Prior to Visit  Medication Sig Dispense Refill   Multiple Vitamin (MULTI-VITAMIN DAILY PO) Take by mouth.     Probiotic Product (PROBIOTIC-10 PO) Take by mouth.     No current facility-administered medications on file prior to visit.    No Known Allergies   Review of Systems Constitutional: negative for chills, fatigue, fevers.  Reports occasional night sweats several times per month.  Eyes: negative for irritation, redness and visual disturbance Ears, nose, mouth, throat, and face: negative for hearing loss, nasal congestion, snoring and tinnitus Respiratory: negative for asthma, cough, sputum Cardiovascular: negative for chest pain, dyspnea, exertional chest pressure/discomfort, irregular heart beat, palpitations and syncope Gastrointestinal:  negative for abdominal pain, change in bowel habits, nausea and vomiting Genitourinary: negative for abnormal menstrual periods, genital lesions, sexual problems and vaginal discharge, dysuria and urinary incontinence Integument/breast: negative for breast lump, breast tenderness and nipple discharge Hematologic/lymphatic: negative for bleeding and easy bruising Musculoskeletal:negative for back pain and muscle weakness Neurological: negative for dizziness, headaches, vertigo and weakness Endocrine: negative for diabetic symptoms including polydipsia, polyuria and skin dryness Allergic/Immunologic: negative for hay fever and urticaria      Objective:   Blood pressure 115/74, pulse 63, resp. rate 16, height 5' 7"  (1.702 m), weight 122 lb 6.4 oz (55.5 kg).  Body mass index is 19.17 kg/m.   General Appearance:    Alert, cooperative, no distress, appears stated age  Head:    Normocephalic, without obvious abnormality, atraumatic  Eyes:    PERRL, conjunctiva/corneas clear, EOM's intact, both eyes  Ears:    Normal external ear canals, both ears  Nose:   Nares normal, septum midline, mucosa normal, no drainage or sinus tenderness  Throat:   Lips, mucosa, and tongue normal; teeth and gums normal  Neck:   Supple, symmetrical, trachea midline, no adenopathy; thyroid: no enlargement/tenderness/nodules; no carotid bruit or JVD  Back:     Symmetric, no curvature, ROM normal, no CVA tenderness  Lungs:     Clear to auscultation bilaterally, respirations unlabored  Chest Wall:    No tenderness or deformity   Heart:    Regular rate and rhythm, S1 and S2 normal, no murmur, rub or gallop  Breast Exam:    No tenderness, masses, or nipple abnormality  Abdomen:     Soft, non-tender, bowel sounds active all four quadrants, no masses, no organomegaly.    Genitalia:    Exam deferred today.   Rectal:    Deferred  Extremities:   Extremities normal, atraumatic, no cyanosis or edema  Pulses:   2+ and symmetric all  extremities  Skin:   Skin color, texture, turgor normal, no rashes or lesions  Lymph nodes:   Cervical, supraclavicular, and axillary nodes normal  Neurologic:   CNII-XII intact, normal strength, sensation and reflexes throughout     Labs:  Lab Results  Component Value Date   WBC 5.9 09/08/2020   HGB 14.3 09/08/2020   HCT 42.1 09/08/2020   MCV 95 09/08/2020   PLT 227 09/08/2020    Lab Results  Component Value Date   CREATININE 0.81 02/12/2020   BUN 12 02/12/2020   NA 141 02/12/2020   K  4.5 02/12/2020   CL 101 02/12/2020   CO2 28 02/12/2020    Lab Results  Component Value Date   ALT 18 02/12/2020   AST 23 02/12/2020   ALKPHOS 64 02/12/2020   BILITOT 0.6 02/12/2020    Lab Results  Component Value Date   TSH 1.280 02/12/2020     Assessment:   1. Well woman exam with routine gynecological exam   2. Need for hepatitis C screening test   3. Breast cancer screening by mammogram   4. History of thyroiditis   5. Night sweats      Plan:  Blood tests: Comprehensive metabolic panel, Lipoproteins, TSH, and HgbA1C. Breast self exam technique reviewed and patient encouraged to perform self-exam monthly. Contraception: condoms. Discussed healthy lifestyle modifications. Mammogram  UTD . Continue yearly screenings.  Pap smear  UTD . Continue q 3-5 year screens.  COVID vaccination status: Completed series and booster.  Discussed occasional night sweats. Discussed lifestyle interventions such as wearing light clothing, remaining in cool environments, having fan/air conditioner in the room, avoiding hot beverages etc.  If symptoms become more bothersome, can assess hormone levels and supplement with herbal or hormonal remedies.  H/o thyroiditis, will check thyroid levels yearly.  Hepatitis C screening recommended at least once in lifetime. Patient ok to perform today.  Follow up in 1 year for annual exam   Rubie Maid, MD [facility]

## 2021-02-16 ENCOUNTER — Encounter: Payer: Self-pay | Admitting: Obstetrics and Gynecology

## 2021-02-16 ENCOUNTER — Ambulatory Visit (INDEPENDENT_AMBULATORY_CARE_PROVIDER_SITE_OTHER): Payer: No Typology Code available for payment source | Admitting: Obstetrics and Gynecology

## 2021-02-16 ENCOUNTER — Other Ambulatory Visit: Payer: Self-pay

## 2021-02-16 VITALS — BP 115/74 | HR 63 | Resp 16 | Ht 67.0 in | Wt 122.4 lb

## 2021-02-16 DIAGNOSIS — R61 Generalized hyperhidrosis: Secondary | ICD-10-CM

## 2021-02-16 DIAGNOSIS — Z1231 Encounter for screening mammogram for malignant neoplasm of breast: Secondary | ICD-10-CM | POA: Diagnosis not present

## 2021-02-16 DIAGNOSIS — Z8639 Personal history of other endocrine, nutritional and metabolic disease: Secondary | ICD-10-CM

## 2021-02-16 DIAGNOSIS — Z01419 Encounter for gynecological examination (general) (routine) without abnormal findings: Secondary | ICD-10-CM

## 2021-02-16 DIAGNOSIS — Z1159 Encounter for screening for other viral diseases: Secondary | ICD-10-CM | POA: Diagnosis not present

## 2021-02-16 NOTE — Patient Instructions (Addendum)
Preventive Care 41-41 Years Old, Female Preventive care refers to lifestyle choices and visits with your health care provider that can promote health and wellness. This includes: A yearly physical exam. This is also called an annual wellness visit. Regular dental and eye exams. Immunizations. Screening for certain conditions. Healthy lifestyle choices, such as: Eating a healthy diet. Getting regular exercise. Not using drugs or products that contain nicotine and tobacco. Limiting alcohol use. What can I expect for my preventive care visit? Physical exam Your health care provider will check your: Height and weight. These may be used to calculate your BMI (body mass index). BMI is a measurement that tells if you are at a healthy weight. Heart rate and blood pressure. Body temperature. Skin for abnormal spots. Counseling Your health care provider may ask you questions about your: Past medical problems. Family's medical history. Alcohol, tobacco, and drug use. Emotional well-being. Home life and relationship well-being. Sexual activity. Diet, exercise, and sleep habits. Work and work environment. Access to firearms. Method of birth control. Menstrual cycle. Pregnancy history. What immunizations do I need? Vaccines are usually given at various ages, according to a schedule. Your health care provider will recommend vaccines for you based on your age, medical history, and lifestyle or other factors, such as travel or where you work. What tests do I need? Blood tests Lipid and cholesterol levels. These may be checked every 5 years, or more often if you are over 50 years old. Hepatitis C test. Hepatitis B test. Screening Lung cancer screening. You may have this screening every year starting at age 55 if you have a 30-pack-year history of smoking and currently smoke or have quit within the past 15 years. Colorectal cancer screening. All adults should have this screening starting at  age 50 and continuing until age 75. Your health care provider may recommend screening at age 45 if you are at increased risk. You will have tests every 1-10 years, depending on your results and the type of screening test. Diabetes screening. This is done by checking your blood sugar (glucose) after you have not eaten for a while (fasting). You may have this done every 1-3 years. Mammogram. This may be done every 1-2 years. Talk with your health care provider about when you should start having regular mammograms. This may depend on whether you have a family history of breast cancer. BRCA-related cancer screening. This may be done if you have a family history of breast, ovarian, tubal, or peritoneal cancers. Pelvic exam and Pap test. This may be done every 3 years starting at age 21. Starting at age 30, this may be done every 5 years if you have a Pap test in combination with an HPV test. Other tests STD (sexually transmitted disease) testing, if you are at risk. Bone density scan. This is done to screen for osteoporosis. You may have this scan if you are at high risk for osteoporosis. Talk with your health care provider about your test results, treatment options, and if necessary, the need for more tests. Follow these instructions at home: Eating and drinking  Eat a diet that includes fresh fruits and vegetables, whole grains, lean protein, and low-fat dairy products. Take vitamin and mineral supplements as recommended by your health care provider. Do not drink alcohol if: Your health care provider tells you not to drink. You are pregnant, may be pregnant, or are planning to become pregnant. If you drink alcohol: Limit how much you have to 0-1 drink a day. Be   aware of how much alcohol is in your drink. In the [place] one drink equals one 12 oz bottle of beer (355 mL), one 5 oz glass of wine (148 mL), or one 1 oz glass of hard liquor (44 mL). Lifestyle Take daily care of your teeth and  gums. Brush your teeth every morning and night with fluoride toothpaste. Floss one time each day. Stay active. Exercise for at least 30 minutes 5 or more days each week. Do not use any products that contain nicotine or tobacco, such as cigarettes, e-cigarettes, and chewing tobacco. If you need help quitting, ask your health care provider. Do not use drugs. If you are sexually active, practice safe sex. Use a condom or other form of protection to prevent STIs (sexually transmitted infections). If you do not wish to become pregnant, use a form of birth control. If you plan to become pregnant, see your health care provider for a prepregnancy visit. If told by your health care provider, take low-dose aspirin daily starting at age 56. Find healthy ways to cope with stress, such as: Meditation, yoga, or listening to music. Journaling. Talking to a trusted person. Spending time with friends and family. Safety Always wear your seat belt while driving or riding in a vehicle. Do not drive: If you have been drinking alcohol. Do not ride with someone who has been drinking. When you are tired or distracted. While texting. Wear a helmet and other protective equipment during sports activities. If you have firearms in your house, make sure you follow all gun safety procedures. What's next? Visit your health care provider once a year for an annual wellness visit. Ask your health care provider how often you should have your eyes and teeth checked. Stay up to date on all vaccines. This information is not intended to replace advice given to you by your health care provider. Make sure you discuss any questions you have with your health care provider. Document Revised: 08/06/2020 Document Reviewed: 02/07/2018 Elsevier Patient Education  2022 [place] Breast self-awareness means being familiar with how your breasts look and feel. It involves checking your breasts regularly and  reporting any changes to your health care provider. Practicing breast self-awareness is important. Sometimes changes may not be harmful (are benign), but sometimes a change in your breasts can be a sign of a serious medical problem. It is important to learn how to do this procedure correctly so that you can catch problems early, when treatment is more likely to be successful. All women should practice breast self-awareness, including women who have had breast implants. What you need: A mirror. A well-lit room. How to do a breast self-exam A breast self-exam is one way to learn what is normal for your breasts and whether your breasts are changing. To do a breast self-exam: Look for changes  Remove all the clothing above your waist. Stand in front of a mirror in a room with good lighting. Put your hands on your hips. Push your hands firmly downward. Compare your breasts in the mirror. Look for differences between them (asymmetry), such as: Differences in shape. Differences in size. Puckers, dips, and bumps in one breast and not the other. Look at each breast for changes in the skin, such as: Redness. Scaly areas. Look for changes in your nipples, such as: Discharge. Bleeding. Dimpling. Redness. A change in position. Feel for changes Carefully feel your breasts for lumps and changes. It is best to do this while lying  on your back on the floor, and again while sitting or standing in the tub or shower with soapy water on your skin. Feel each breast in the following way: Place the arm on the side of the breast you are examining above your head. Feel your breast with the other hand. Start in the nipple area and make -inch (2 cm) overlapping circles to feel your breast. Use the pads of your three middle fingers to do this. Apply light pressure, then medium pressure, then firm pressure. The light pressure will allow you to feel the tissue closest to the skin. The medium pressure will allow you  to feel the tissue that is a little deeper. The firm pressure will allow you to feel the tissue close to the ribs. Continue the overlapping circles, moving downward over the breast until you feel your ribs below your breast. Move one finger-width toward the center of the body. Continue to use the -inch (2 cm) overlapping circles to feel your breast as you move slowly up toward your collarbone. Continue the up-and-down exam using all three pressures until you reach your armpit.  Write down what you find Writing down what you find can help you remember what to discuss with your health care provider. Write down: What is normal for each breast. Any changes that you find in each breast, including: The kind of changes you find. Any pain or tenderness. Size and location of any lumps. Where you are in your menstrual cycle, if you are still menstruating. General tips and recommendations Examine your breasts every month. If you are breastfeeding, the best time to examine your breasts is after a feeding or after using a breast pump. If you menstruate, the best time to examine your breasts is 5-7 days after your period. Breasts are generally lumpier during menstrual periods, and it may be more difficult to notice changes. With time and practice, you will become more familiar with the variations in your breasts and more comfortable with the exam. Contact a health care provider if you: See a change in the shape or size of your breasts or nipples. See a change in the skin of your breast or nipples, such as a reddened or scaly area. Have unusual discharge from your nipples. Find a lump or thick area that was not there before. Have pain in your breasts. Have any concerns related to your breast health. Summary Breast self-awareness includes looking for physical changes in your breasts, as well as feeling for any changes within your breasts. Breast self-awareness should be performed in front of a mirror in  a well-lit room. You should examine your breasts every month. If you menstruate, the best time to examine your breasts is 5-7 days after your menstrual period. Let your health care provider know of any changes you notice in your breasts, including changes in size, changes on the skin, pain or tenderness, or unusual fluid from your nipples. This information is not intended to replace advice given to you by your health care provider. Make sure you discuss any questions you have with your health care provider. Document Revised: 01/15/2018 Document Reviewed: 01/15/2018 Elsevier Patient Education  [place].

## 2021-02-17 LAB — LIPID PANEL
Chol/HDL Ratio: 2.5 ratio (ref 0.0–4.4)
Cholesterol, Total: 137 mg/dL (ref 100–199)
HDL: 54 mg/dL (ref >39)
LDL Chol Calc (NIH): 71 mg/dL (ref 0–99)
Triglycerides: 55 mg/dL (ref 0–149)
VLDL Cholesterol Cal: 12 mg/dL (ref 5–40)

## 2021-02-17 LAB — COMPREHENSIVE METABOLIC PANEL
ALT: 23 IU/L (ref 0–32)
AST: 23 IU/L (ref 0–40)
Albumin/Globulin Ratio: 2.1 (ref 1.2–2.2)
Albumin: 4.6 g/dL (ref 3.8–4.8)
Alkaline Phosphatase: 58 IU/L (ref 44–121)
BUN/Creatinine Ratio: 16 (ref 9–23)
BUN: 12 mg/dL (ref 6–24)
Bilirubin Total: 0.4 mg/dL (ref 0.0–1.2)
CO2: 25 mmol/L (ref 20–29)
Calcium: 9.5 mg/dL (ref 8.7–10.2)
Chloride: 101 mmol/L (ref 96–106)
Creatinine, Ser: 0.73 mg/dL (ref 0.57–1.00)
Globulin, Total: 2.2 g/dL (ref 1.5–4.5)
Glucose: 81 mg/dL (ref 65–99)
Potassium: 4.5 mmol/L (ref 3.5–5.2)
Sodium: 138 mmol/L (ref 134–144)
Total Protein: 6.8 g/dL (ref 6.0–8.5)
eGFR: 107 mL/min/{1.73_m2} (ref >59)

## 2021-02-17 LAB — HEMOGLOBIN A1C
Est. average glucose Bld gHb Est-mCnc: 108 mg/dL
Hgb A1c MFr Bld: 5.4 % (ref 4.8–5.6)

## 2021-02-17 LAB — HEPATITIS C ANTIBODY: Hep C Virus Ab: <0.1 s/co ratio (ref 0.0–0.9)

## 2021-02-17 LAB — TSH: TSH: 1.01 u[IU]/mL (ref 0.450–4.500)

## 2021-06-08 ENCOUNTER — Ambulatory Visit (INDEPENDENT_AMBULATORY_CARE_PROVIDER_SITE_OTHER): Payer: No Typology Code available for payment source | Admitting: Obstetrics and Gynecology

## 2021-06-08 ENCOUNTER — Encounter: Payer: Self-pay | Admitting: Obstetrics and Gynecology

## 2021-06-08 ENCOUNTER — Other Ambulatory Visit: Payer: Self-pay

## 2021-06-08 VITALS — BP 88/58 | HR 81 | Ht 67.0 in | Wt 123.7 lb

## 2021-06-08 DIAGNOSIS — O09299 Supervision of pregnancy with other poor reproductive or obstetric history, unspecified trimester: Secondary | ICD-10-CM

## 2021-06-08 DIAGNOSIS — Z3201 Encounter for pregnancy test, result positive: Secondary | ICD-10-CM | POA: Diagnosis not present

## 2021-06-08 DIAGNOSIS — N912 Amenorrhea, unspecified: Secondary | ICD-10-CM | POA: Diagnosis not present

## 2021-06-08 LAB — POCT URINE PREGNANCY: Preg Test, Ur: POSITIVE — AB

## 2021-06-08 MED ORDER — ASPIRIN EC 81 MG PO TBEC
81.0000 mg | DELAYED_RELEASE_TABLET | Freq: Every day | ORAL | 1 refills | Status: DC
  Filled 2021-06-08: qty 90, 90d supply, fill #0

## 2021-06-08 MED ORDER — PROGESTERONE 200 MG PO CAPS
ORAL_CAPSULE | ORAL | 3 refills | Status: DC
  Filled 2021-06-08: qty 30, 30d supply, fill #0
  Filled 2021-07-05: qty 30, 30d supply, fill #1

## 2021-06-08 NOTE — Progress Notes (Signed)
° ° °  GYNECOLOGY CLINIC PROGRESS NOTE  Subjective:    Lindsay Owens is a 40 y.o. [phone] female who presents for evaluation of amenorrhea. She believes she could be pregnant. Pregnancy is desired. Sexual Activity: single partner, contraception: none (previously using condoms until this month). Current symptoms also include: positive home pregnancy test. Last period was normal.  Patient's last menstrual period was 05/17/2021 (exact date). Cycles are  regular, usually q 24 days.    The following portions of the patient's history were reviewed and updated as appropriate: allergies, current medications, past family history, past medical history, past social history, past surgical history and problem list.  Review of Systems Pertinent items noted in HPI and remainder of comprehensive ROS otherwise negative.       Objective:    BP (!) 88/58    Pulse 81    Ht 5' 7"  (1.702 m)    Wt 123 lb 11.2 oz (56.1 kg)    LMP 05/17/2021 (Exact Date)    BMI 19.37 kg/m  General: alert, no distress and no acute distress    Lab Review Urine HCG: positive    Assessment:   1. Amenorrhea   2. History of miscarriage, currently pregnant   3. Positive pregnancy test    Plan:   - Pregnancy Test: Positive: EDC: 02/21/2022, with EGA 3.1 weeks. Briefly discussed pre-natal care options. Pregnancy, Childbirth and the Newborn book given. Encouraged well-balanced diet, plenty of rest when needed, pre-natal vitamins daily and walking for exercise. Discussed self-help for nausea, avoiding OTC medications until consulting provider or pharmacist, other than Tylenol as needed, minimal caffeine (1-2 cups daily) and avoiding alcohol. She will schedule her initial OB visit in at 12 weeks. Feel free to call with any questions. To schedule ultrasound in 3-4 weeks.  - History of miscarriages x 2, will check progesterone levels, recommend starting daily progesterone until out of 1st trimester. Also would recommend beginning daily baby  aspirin.  - Advanced maternal age, recommend genetic screening early in pregnancy.    Rubie Maid, MD [facility]

## 2021-06-09 LAB — PROGESTERONE: Progesterone: 13.2 ng/mL

## 2021-06-12 NOTE — L&D Delivery Note (Signed)
Delivery Summary for Lindsay Owens  Labor Events:   Preterm labor: No data found  Rupture date: 02/22/2022  Rupture time: 8:07 AM  Rupture type: Artificial  Fluid Color: Clear  Induction: No data found  Augmentation: No data found  Complications: No data found  Cervical ripening: No data found No data found   No data found     Delivery:   Episiotomy: No data found  Lacerations: No data found  Repair suture: No data found  Repair # of packets: No data found  Blood loss (ml): No data found   Information for the patient's newborn:  Lindsay, Owens [[id number]]   Delivery 02/22/2022 12:24 PM by  Vaginal, Spontaneous Sex:  female Gestational Age: 50w6dDelivery Clinician:   Living?:         APGARS  One minute Five minutes Ten minutes  Skin color:        Heart rate:        Grimace:        Muscle tone:        Breathing:        Totals: 8  9      Presentation/position:      Resuscitation:   Cord information:    Disposition of cord blood:     Blood gases sent?  Complications:   Placenta: Delivered:       appearance Newborn Measurements: Weight: 8 lb 8.9 oz (3880 g)  Height: 20.87"  Head circumference:    Chest circumference:    Other providers:    Additional  information: Forceps:   Vacuum:   Breech:   Observed anomalies       Delivery Note At 12:24 PM a viable and healthy female was delivered via Vaginal, Spontaneous (Presentation: Left Occiput Anterior).  APGAR: 8, 9; weight 8 lb 8.9 oz (3880 g).   Placenta status: Spontaneous, Intact.  Cord: 3 vessels with the following complications: None.  Cord pH: not obtained.   Anesthesia: Epidural Episiotomy: None Lacerations: 1st degree;Perineal Suture Repair: 3.0 vicryl rapide Est. Blood Loss (mL): 150  Mom to postpartum.  Baby to Couplet care / Skin to Skin.  ARubie Maid9/13/2023, 1:45 PM

## 2021-07-01 ENCOUNTER — Ambulatory Visit (INDEPENDENT_AMBULATORY_CARE_PROVIDER_SITE_OTHER): Payer: No Typology Code available for payment source | Admitting: Obstetrics and Gynecology

## 2021-07-01 ENCOUNTER — Other Ambulatory Visit: Payer: Self-pay

## 2021-07-01 VITALS — Resp 16 | Ht 67.0 in | Wt 123.0 lb

## 2021-07-01 DIAGNOSIS — Z3481 Encounter for supervision of other normal pregnancy, first trimester: Secondary | ICD-10-CM

## 2021-07-01 DIAGNOSIS — Z3A01 Less than 8 weeks gestation of pregnancy: Secondary | ICD-10-CM

## 2021-07-01 DIAGNOSIS — Z113 Encounter for screening for infections with a predominantly sexual mode of transmission: Secondary | ICD-10-CM

## 2021-07-01 DIAGNOSIS — O09299 Supervision of pregnancy with other poor reproductive or obstetric history, unspecified trimester: Secondary | ICD-10-CM

## 2021-07-01 NOTE — Patient Instructions (Signed)
First Trimester of Pregnancy The first trimester of pregnancy starts on the first day of your last menstrual period until the end of week 12. This is also called months 1 through 3 of pregnancy. Body changes during your first trimester Your body goes through many changes during pregnancy. The changes usually return to normal after your baby is born. Physical changes You may gain or lose weight. Your breasts may grow larger and hurt. The area around your nipples may get darker. Dark spots or blotches may develop on your face. You may have changes in your hair. Health changes You may feel like you might vomit (nauseous), and you may vomit. You may have heartburn. You may have headaches. You may have trouble pooping (constipation). Your gums may bleed. Other changes You may get tired easily. You may pee (urinate) more often. Your menstrual periods will stop. You may not feel hungry. You may want to eat certain kinds of food. You may have changes in your emotions from day to day. You may have more dreams. Follow these instructions at home: Medicines Take over-the-counter and prescription medicines only as told by your doctor. Some medicines are not safe during pregnancy. Take a prenatal vitamin that contains at least 600 micrograms (mcg) of folic acid. Eating and drinking Eat healthy meals that include: Fresh fruits and vegetables. Whole grains. Good sources of protein, such as meat, eggs, or tofu. Low-fat dairy products. Avoid raw meat and unpasteurized juice, milk, and cheese. If you feel like you may vomit, or you vomit: Eat 4 or 5 small meals a day instead of 3 large meals. Try eating a few soda crackers. Drink liquids between meals instead of during meals. You may need to take these actions to prevent or treat trouble pooping: Drink enough fluids to keep your pee (urine) pale yellow. Eat foods that are high in fiber. These include beans, whole grains, and fresh fruits and  vegetables. Limit foods that are high in fat and sugar. These include fried or sweet foods. Activity Exercise only as told by your doctor. Most people can do their usual exercise routine during pregnancy. Stop exercising if you have cramps or pain in your lower belly (abdomen) or low back. Do not exercise if it is too hot or too humid, or if you are in a place of great height (high altitude). Avoid heavy lifting. If you choose to, you may have sex unless your doctor tells you not to. Relieving pain and discomfort Wear a good support bra if your breasts are sore. Rest with your legs raised (elevated) if you have leg cramps or low back pain. If you have bulging veins (varicose veins) in your legs: Wear support hose as told by your doctor. Raise your feet for 15 minutes, 3-4 times a day. Limit salt in your food. Safety Wear your seat belt at all times when you are in a car. Talk with your doctor if someone is hurting you or yelling at you. Talk with your doctor if you are feeling sad or have thoughts of hurting yourself. Lifestyle Do not use hot tubs, steam rooms, or saunas. Do not douche. Do not use tampons or scented sanitary pads. Do not use herbal medicines, illegal drugs, or medicines that are not approved by your doctor. Do not drink alcohol. Do not smoke or use any products that contain nicotine or tobacco. If you need help quitting, ask your doctor. Avoid cat litter boxes and soil that is used by cats. These carry germs  that can cause harm to the baby and can cause a loss of your baby by miscarriage or stillbirth. General instructions Keep all follow-up visits. This is important. Ask for help if you need counseling or if you need help with nutrition. Your doctor can give you advice or tell you where to go for help. Visit your dentist. At home, brush your teeth with a soft toothbrush. Floss gently. Write down your questions. Take them to your prenatal visits. Where to find more  information American Pregnancy Association: americanpregnancy.org [employer]: www.acog.org Office on Women's Health: KeywordPortfolios.com.br Contact a doctor if: You are dizzy. You have a fever. You have mild cramps or pressure in your lower belly. You have a nagging pain in your belly area. You continue to feel like you may vomit, you vomit, or you have watery poop (diarrhea) for 24 hours or longer. You have a bad-smelling fluid coming from your vagina. You have pain when you pee. You are exposed to a disease that spreads from person to person, such as chickenpox, measles, Zika virus, HIV, or hepatitis. Get help right away if: You have spotting or bleeding from your vagina. You have very bad belly cramping or pain. You have shortness of breath or chest pain. You have any kind of injury, such as from a fall or a car crash. You have new or increased pain, swelling, or redness in an arm or leg. Summary The first trimester of pregnancy starts on the first day of your last menstrual period until the end of week 12 (months 1 through 3). Eat 4 or 5 small meals a day instead of 3 large meals. Do not smoke or use any products that contain nicotine or tobacco. If you need help quitting, ask your doctor. Keep all follow-up visits. This information is not intended to replace advice given to you by your health care provider. Make sure you discuss any questions you have with your health care provider. Document Revised: 11/05/2019 Document Reviewed: 09/11/2019 Elsevier Patient Education  [place]. Morning Sickness Morning sickness is when you feel like you may vomit (feel nauseous) during pregnancy. Sometimes, you may vomit. Morning sickness most often happens in the morning, but it can also happen at any time of the day. Some women may have morning sickness that makes them vomit all the time. This is a more serious problem that needs  treatment. What are the causes? The cause of this condition is not known. What increases the risk? You had vomiting or a feeling like you may vomit before your pregnancy. You had morning sickness in another pregnancy. You are pregnant with more than one baby, such as twins. What are the signs or symptoms? Feeling like you may vomit. Vomiting. How is this treated? Treatment is usually not needed for this condition. You may only need to change what you eat. In some cases, your doctor may give you some things to take for your condition. These include: Vitamin B6 supplements. Medicines to treat the feeling that you may vomit. Ginger. Follow these instructions at home: Medicines Take over-the-counter and prescription medicines only as told by your doctor. Do not take any medicines until you talk with your doctor about them first. Take multivitamins before you get pregnant. These can stop or lessen the symptoms of morning sickness. Eating and drinking Eat dry toast or crackers before getting out of bed. Eat 5 or 6 small meals a day. Eat dry and bland foods like rice and  baked potatoes. Do not eat greasy, fatty, or spicy foods. Have someone cook for you if the smell of food causes you to vomit or to feel like you may vomit. If you feel like you may vomit after taking prenatal vitamins, take them at night or with a snack. Eat protein foods when you need a snack. Nuts, yogurt, and cheese are good choices. Drink fluids throughout the day. Try ginger ale made with real ginger, ginger tea made from fresh grated ginger, or ginger candies. General instructions Do not smoke or use any products that contain nicotine or tobacco. If you need help quitting, ask your doctor. Use an air purifier to keep the air in your house free of smells. Get lots of fresh air. Try to avoid smells that make you feel sick. Try wearing an acupressure wristband. This is a wristband that is used to treat seasickness. Try  a treatment called acupuncture. In this treatment, a doctor puts needles into certain areas of your body to make you feel better. Contact a doctor if: You need medicine to feel better. You feel dizzy or light-headed. You are losing weight. Get help right away if: The feeling that you may vomit will not go away, or you cannot stop vomiting. You faint. You have very bad pain in your belly. Summary Morning sickness is when you feel like you may vomit (feel nauseous) during pregnancy. You may feel sick in the morning, but you can feel this way at any time of the day. Making some changes to what you eat may help your symptoms go away. This information is not intended to replace advice given to you by your health care provider. Make sure you discuss any questions you have with your health care provider. Document Revised: 01/12/2020 Document Reviewed: 12/22/2019 Elsevier Patient Education  2022 [place]. Commonly Asked Questions During Pregnancy  Cats: A parasite can be excreted in cat feces.  To avoid exposure you need to have another person empty the little box.  If you must empty the litter box you will need to wear gloves.  Wash your hands after handling your cat.  This parasite can also be found in raw or undercooked meat so this should also be avoided.  Colds, Sore Throats, Flu: Please check your medication sheet to see what you can take for symptoms.  If your symptoms are unrelieved by these medications please call the office.  Dental Work: Most any dental work Investment banker, corporate recommends is permitted.  X-rays should only be taken during the first trimester if absolutely necessary.  Your abdomen should be shielded with a lead apron during all x-rays.  Please notify your provider prior to receiving any x-rays.  Novocaine is fine; gas is not recommended.  If your dentist requires a note from [place] prior to dental work please call the office and we will provide one for you.  Exercise: Exercise is  an important part of staying healthy during your pregnancy.  You may continue most exercises you were accustomed to prior to pregnancy.  Later in your pregnancy you will most likely notice you have difficulty with activities requiring balance like riding a bicycle.  It is important that you listen to your body and avoid activities that put you at a higher risk of falling.  Adequate rest and staying well hydrated are a must!  If you have questions about the safety of specific activities ask your provider.    Exposure to Children with illness: Try to avoid obvious  exposure; report any symptoms to [place] when noted,  If you have chicken pos, red measles or mumps, you should be immune to these diseases.   Please do not take any vaccines while pregnant unless you have checked with your OB provider.  Fetal Movement: After 28 weeks we recommend you do "kick counts" twice daily.  Lie or sit down in a calm quiet environment and count your baby movements "kicks".  You should feel your baby at least 10 times per hour.  If you have not felt 10 kicks within the first hour get up, walk around and have something sweet to eat or drink then repeat for an additional hour.  If count remains less than 10 per hour notify your provider.  Fumigating: Follow your pest control agent's advice as to how long to stay out of your home.  Ventilate the area well before re-entering.  Hemorrhoids:   Most over-the-counter preparations can be used during pregnancy.  Check your medication to see what is safe to use.  It is important to use a stool softener or fiber in your diet and to drink lots of liquids.  If hemorrhoids seem to be getting worse please call the office.   Hot Tubs:  Hot tubs Jacuzzis and saunas are not recommended while pregnant.  These increase your internal body temperature and should be avoided.  Intercourse:  Sexual intercourse is safe during pregnancy as long as you are comfortable, unless otherwise advised by your  provider.  Spotting may occur after intercourse; report any bright red bleeding that is heavier than spotting.  Labor:  If you know that you are in labor, please go to the hospital.  If you are unsure, please call the office and let us help you decide what to do.  Lifting, straining, etc:  If your job requires heavy lifting or straining please check with your provider for any limitations.  Generally, you should not lift items heavier than that you can lift simply with your hands and arms (no back muscles)  Painting:  Paint fumes do not harm your pregnancy, but may make you ill and should be avoided if possible.  Latex or water based paints have less odor than oils.  Use adequate ventilation while painting.  Permanents & Hair Color:  Chemicals in hair dyes are not recommended as they cause increase hair dryness which can increase hair loss during pregnancy.  " Highlighting" and permanents are allowed.  Dye may be absorbed differently and permanents may not hold as well during pregnancy.  Sunbathing:  Use a sunscreen, as skin burns easily during pregnancy.  Drink plenty of fluids; avoid over heating.  Tanning Beds:  Because their possible side effects are still unknown, tanning beds are not recommended.  Ultrasound Scans:  Routine ultrasounds are performed at approximately 20 weeks.  You will be able to see your baby's general anatomy an if you would like to know the gender this can usually be determined as well.  If it is questionable when you conceived you may also receive an ultrasound early in your pregnancy for dating purposes.  Otherwise ultrasound exams are not routinely performed unless there is a medical necessity.  Although you can request a scan we ask that you pay for it when conducted because insurance does not cover " patient request" scans.  Work: If your pregnancy proceeds without complications you may work until your due date, unless your physician or employer advises  otherwise.  Round Ligament Pain/Pelvic Discomfort:  Hervey Ard,  shooting pains not associated with bleeding are fairly common, usually occurring in the second trimester of pregnancy.  They tend to be worse when standing up or when you remain standing for long periods of time.  These are the result of pressure of certain pelvic ligaments called "round ligaments".  Rest, Tylenol and heat seem to be the most effective relief.  As the womb and fetus grow, they rise out of the pelvis and the discomfort improves.  Please notify the office if your pain seems different than that described.  It may represent a more serious condition.

## 2021-07-01 NOTE — Progress Notes (Addendum)
I connected with  Lindsay Owens on 07/01/2021 by a video enabled telemedicine application and verified that I am speaking with the correct person using two identifiers.  Time spent: 20 minutes My Location: Office ([facility]) Patient Location: Home Provider: Rubie Maid, MD  I discussed the limitations of evaluation and management by telemedicine. The patient expressed understanding and agreed to proceed.    [name] presents for NOB nurse interview visit. Pregnancy confirmation done 06/08/2021.  [id number]. No pregnancy education material explained or given, this was a telephone visit. No cats in the home. NOB labs ordered. HIV labs and Drug screen were explained optional and she did not decline. Drug screen ordered. PNV encouraged. Genetic screening options discussed. Genetic testing: Ordered.  Pt may discuss with provider. Financial policy reviewed. FMLA forms not reviewed or signed. Pt. To follow up with [facility] in 4 weeks for NOB physical.  All questions answered.

## 2021-07-05 ENCOUNTER — Other Ambulatory Visit: Payer: Self-pay

## 2021-07-05 ENCOUNTER — Ambulatory Visit (INDEPENDENT_AMBULATORY_CARE_PROVIDER_SITE_OTHER): Payer: No Typology Code available for payment source

## 2021-07-05 DIAGNOSIS — O09299 Supervision of pregnancy with other poor reproductive or obstetric history, unspecified trimester: Secondary | ICD-10-CM | POA: Diagnosis not present

## 2021-08-02 ENCOUNTER — Other Ambulatory Visit: Payer: Self-pay | Admitting: Obstetrics and Gynecology

## 2021-08-02 ENCOUNTER — Encounter: Payer: Self-pay | Admitting: Obstetrics and Gynecology

## 2021-08-02 ENCOUNTER — Ambulatory Visit (INDEPENDENT_AMBULATORY_CARE_PROVIDER_SITE_OTHER): Payer: No Typology Code available for payment source | Admitting: Obstetrics and Gynecology

## 2021-08-02 ENCOUNTER — Other Ambulatory Visit: Payer: Self-pay

## 2021-08-02 VITALS — BP 89/52 | HR 67 | Ht 67.0 in | Wt 126.6 lb

## 2021-08-02 DIAGNOSIS — Z3481 Encounter for supervision of other normal pregnancy, first trimester: Secondary | ICD-10-CM

## 2021-08-02 DIAGNOSIS — O09521 Supervision of elderly multigravida, first trimester: Secondary | ICD-10-CM

## 2021-08-02 DIAGNOSIS — N96 Recurrent pregnancy loss: Secondary | ICD-10-CM

## 2021-08-02 DIAGNOSIS — Z113 Encounter for screening for infections with a predominantly sexual mode of transmission: Secondary | ICD-10-CM

## 2021-08-02 DIAGNOSIS — Z1379 Encounter for other screening for genetic and chromosomal anomalies: Secondary | ICD-10-CM

## 2021-08-02 DIAGNOSIS — Z3A11 11 weeks gestation of pregnancy: Secondary | ICD-10-CM

## 2021-08-02 LAB — POCT URINALYSIS DIPSTICK OB
Bilirubin, UA: NEGATIVE
Blood, UA: NEGATIVE
Glucose, UA: NEGATIVE
Ketones, UA: NEGATIVE
Leukocytes, UA: NEGATIVE
Nitrite, UA: NEGATIVE
POC,PROTEIN,UA: NEGATIVE
Spec Grav, UA: 1.02 (ref 1.010–1.025)
Urobilinogen, UA: 0.2 E.U./dL
pH, UA: 6.5 (ref 5.0–8.0)

## 2021-08-02 NOTE — Progress Notes (Signed)
NOB Physical: She is doing well. No new concerns today. No surprise gender.

## 2021-08-02 NOTE — Progress Notes (Signed)
OBSTETRIC INITIAL PRENATAL VISIT  Subjective:    Lindsay Owens is being seen today for her first obstetrical visit.  This is a planned pregnancy. She is a 41 y.o. [id number] female at 5w0dgestation, Estimated Date of Delivery: 02/16/22,  with Patient's last menstrual period was 05/17/2021 (exact date).,  inconsistent with 7 week sono. Her obstetrical history is significant for advanced maternal age and history of recurrent miscarriages . Currently on vaginal progesterone supplementation. Relationship with FOB: spouse, living together. Patient does intend to breast feed. Pregnancy history fully reviewed.  Reports some nausea, but is mild, mostly at night, manages with rest.     OB History  Gravida Para Term Preterm AB Living  7 4 4  0 2 4  SAB IAB Ectopic Multiple Live Births  2 0 0 0 4    # Outcome Date GA Lbr Len/2nd Weight Sex Delivery Anes PTL Lv  7 Current           6 SAB 08/2020          5 SAB 2021          4 Term 05/02/16 [redacted]w[redacted]d 01:22 8 lb 12 oz (3.97 kg) F Vag-Spont EPI  LIV     Name: Roland,PENDINGBABY     Apgar1: 8  Apgar5: 9  3 Term 11/20/12   8 lb 1.8 oz (3.679 kg) F Vag-Spont   LIV  2 Term 02/03/09   7 lb 2.1 oz (3.234 kg) M Vag-Spont   LIV  1 Term 05/08/07   8 lb 6.4 oz (3.81 kg) F Vag-Spont   LIV    Gynecologic History:  Last pap smear was 02/12/2020.  Results were Normal. Denies h/o abnormal pap smears in the past.  Denies history of STIs.  Contraception prior to conception: None   Past Medical History:  Diagnosis Date   Acute mastitis of left breast    Miscarriage    Ulcerative colitis ([employer]   controlled    Family History  Problem Relation Age of Onset   Arthritis Mother    Prostate cancer Father    Heart disease Maternal Grandmother    Breast cancer Maternal Aunt    Colon cancer Neg Hx    Ovarian cancer Neg Hx    Diabetes Neg Hx     Past Surgical History:  Procedure Laterality Date   NO PAST SURGERIES      Social History   Socioeconomic  History   Marital status: Married    Spouse name: Not on file   Number of children: Not on file   Years of education: Not on file   Highest education level: Not on file  Occupational History   Not on file  Tobacco Use   Smoking status: Never    Passive exposure: Never   Smokeless tobacco: Never  Vaping Use   Vaping Use: Never used  Substance and Sexual Activity   Alcohol use: Not Currently    Comment: rare   Drug use: No   Sexual activity: Yes    Birth control/protection: None  Other Topics Concern   Not on file  Social History Narrative   Not on file   Social Determinants of Health   Financial Resource Strain: Not on file  Food Insecurity: Not on file  Transportation Needs: Not on file  Physical Activity: Not on file  Stress: Not on file  Social Connections: Not on file  Intimate Partner Violence: Not on file    Current Outpatient  Medications on File Prior to Visit  Medication Sig Dispense Refill   aspirin EC 81 MG tablet Take 1 tablet (81 mg total) by mouth daily. Swallow whole. 90 tablet 1   Multiple Vitamin (MULTI-VITAMIN DAILY PO) Take by mouth.     Probiotic Product (PROBIOTIC-10 PO) Take by mouth.     progesterone (PROMETRIUM) 200 MG capsule Place one capsule vaginally at bedtime 30 capsule 3   No current facility-administered medications on file prior to visit.    No Known Allergies   Review of Systems General: Not Present- Fever, Weight Loss and Weight Gain. Skin: Not Present- Rash. HEENT: Not Present- Blurred Vision, Headache and Bleeding Gums. Respiratory: Not Present- Difficulty Breathing. Breast: Not Present- Breast Mass. Cardiovascular: Not Present- Chest Pain, Elevated Blood Pressure, Fainting / Blacking Out and Shortness of Breath. Gastrointestinal: Not Present- Abdominal Pain, Constipation, Nausea and Vomiting. Female Genitourinary: Not Present- Frequency, Painful Urination, Pelvic Pain, Vaginal Bleeding, Vaginal Discharge, Contractions,  regular, Fetal Movements Decreased, Urinary Complaints and Vaginal Fluid. Musculoskeletal: Not Present- Back Pain and Leg Cramps. Neurological: Not Present- Dizziness. Psychiatric: Not Present- Depression.     Objective:   Blood pressure (!) 89/52, pulse 67, weight 126 lb 9.6 oz (57.4 kg), last menstrual period 05/17/2021.   Body mass index is 19.83 kg/m.  General Appearance:    Alert, cooperative, no distress, appears stated age  Head:    Normocephalic, without obvious abnormality, atraumatic  Eyes:    PERRL, conjunctiva/corneas clear, EOM's intact, both eyes  Ears:    Normal external ear canals, both ears  Nose:   Nares normal, septum midline, mucosa normal, no drainage or sinus tenderness  Throat:   Lips, mucosa, and tongue normal; teeth and gums normal  Neck:   Supple, symmetrical, trachea midline, no adenopathy; thyroid: no enlargement/tenderness/nodules; no carotid bruit or JVD  Back:     Symmetric, no curvature, ROM normal, no CVA tenderness  Lungs:     Clear to auscultation bilaterally, respirations unlabored  Chest Wall:    No tenderness or deformity   Heart:    Regular rate and rhythm, S1 and S2 normal, no murmur, rub or gallop  Breast Exam:    No tenderness, masses, or nipple abnormality  Abdomen:     Soft, non-tender, bowel sounds active all four quadrants, no masses, no organomegaly.  FHT 172 bpm.  Genitalia:    Pelvic:deferred. Patient up to date with pelvic exam since last physica.   Extremities:   Extremities normal, atraumatic, no cyanosis or edema  Pulses:   2+ and symmetric all extremities  Skin:   Skin color, texture, turgor normal, no rashes or lesions  Lymph nodes:   Cervical, supraclavicular, and axillary nodes normal  Neurologic:   CNII-XII intact, normal strength, sensation and reflexes throughout     Assessment:   1. Supervision of elderly multigravida (>=23 years old at time of delivery), first trimester   2. [redacted] weeks gestation of pregnancy   3. Screen  for STD (sexually transmitted disease)   4. Genetic screening   5. History of recurrent miscarriages     Plan:   Supervision of elderly multigravida in first trimester - Initial labs ordered. - Prenatal vitamins encouraged. - Problem list reviewed and updated. - New OB counseling:  The patient has been given an overview regarding routine prenatal care.  Recommendations regarding diet, weight gain, and exercise in pregnancy were given. - Prenatal testing, optional genetic testing, and ultrasound use in pregnancy were reviewed.  Traditional genetic screening  vs cell-fee DNA genetic screening discussed, including risks and benefits. Testing ordered ([facility]). - Benefits of Breast Feeding were discussed. The patient is encouraged to consider nursing her baby post partum.   2.   History of recurrent miscarriages - Currently on baby aspirin and vaginal progesterone supplementation. Advised that she can discontinue use of progesterone at 12th week.  To continue daily baby aspiring for AMA status.   Follow up in 4 weeks.    Rubie Maid, MD [facility]

## 2021-08-03 LAB — NICOTINE SCREEN, URINE: Cotinine Ql Scrn, Ur: NEGATIVE ng/mL

## 2021-08-03 LAB — PAIN MGT SCRN (14 DRUGS), UR
Amphetamine Scrn, Ur: NEGATIVE ng/mL
BARBITURATE SCREEN URINE: NEGATIVE ng/mL
BENZODIAZEPINE SCREEN, URINE: NEGATIVE ng/mL
Buprenorphine, Urine: NEGATIVE ng/mL
CANNABINOIDS UR QL SCN: NEGATIVE ng/mL
Cocaine (Metab) Scrn, Ur: NEGATIVE ng/mL
Creatinine(Crt), U: 95.1 mg/dL (ref 20.0–300.0)
Fentanyl, Urine: NEGATIVE pg/mL
Meperidine Screen, Urine: NEGATIVE ng/mL
Methadone Screen, Urine: NEGATIVE ng/mL
OXYCODONE+OXYMORPHONE UR QL SCN: NEGATIVE ng/mL
Opiate Scrn, Ur: NEGATIVE ng/mL
Ph of Urine: 6.7 (ref 4.5–8.9)
Phencyclidine Qn, Ur: NEGATIVE ng/mL
Propoxyphene Scrn, Ur: NEGATIVE ng/mL
Tramadol Screen, Urine: NEGATIVE ng/mL

## 2021-08-03 LAB — URINALYSIS, ROUTINE W REFLEX MICROSCOPIC
Bilirubin, UA: NEGATIVE
Glucose, UA: NEGATIVE
Ketones, UA: NEGATIVE
Leukocytes,UA: NEGATIVE
Nitrite, UA: NEGATIVE
Protein,UA: NEGATIVE
RBC, UA: NEGATIVE
Specific Gravity, UA: 1.023 (ref 1.005–1.030)
Urobilinogen, Ur: 0.2 mg/dL (ref 0.2–1.0)
pH, UA: 6.5 (ref 5.0–7.5)

## 2021-08-03 LAB — HIV ANTIBODY (ROUTINE TESTING W REFLEX): HIV Screen 4th Generation wRfx: NONREACTIVE

## 2021-08-03 LAB — ABO AND RH: Rh Factor: POSITIVE

## 2021-08-03 LAB — HCV INTERPRETATION

## 2021-08-03 LAB — VIRAL HEPATITIS HBV, HCV
HCV Ab: NONREACTIVE
Hep B Core Total Ab: NEGATIVE
Hep B Surface Ab, Qual: REACTIVE
Hepatitis B Surface Ag: NEGATIVE

## 2021-08-03 LAB — VARICELLA ZOSTER ANTIBODY, IGG: Varicella zoster IgG: 3176 index (ref >165)

## 2021-08-03 LAB — PARVOVIRUS B19 ANTIBODY, IGG AND IGM
Parvovirus B19 IgG: 6.1 index — ABNORMAL HIGH (ref 0.0–0.8)
Parvovirus B19 IgM: 0.2 index (ref 0.0–0.8)

## 2021-08-03 LAB — RUBELLA SCREEN: Rubella Antibodies, IGG: 4.02 index (ref >0.99)

## 2021-08-03 LAB — RPR: RPR Ser Ql: NONREACTIVE

## 2021-08-03 LAB — ANTIBODY SCREEN: Antibody Screen: NEGATIVE

## 2021-08-04 LAB — URINE CULTURE, OB REFLEX: Organism ID, Bacteria: NO GROWTH

## 2021-08-04 LAB — GC/CHLAMYDIA PROBE AMP
Chlamydia trachomatis, NAA: NEGATIVE
Neisseria Gonorrhoeae by PCR: NEGATIVE

## 2021-08-04 LAB — CULTURE, OB URINE

## 2021-08-29 ENCOUNTER — Other Ambulatory Visit: Payer: Self-pay

## 2021-08-29 MED ORDER — AMOXICILLIN-POT CLAVULANATE 875-125 MG PO TABS
ORAL_TABLET | ORAL | 0 refills | Status: DC
  Filled 2021-08-29: qty 20, 10d supply, fill #0

## 2021-08-30 ENCOUNTER — Encounter: Payer: Self-pay | Admitting: Obstetrics and Gynecology

## 2021-08-30 ENCOUNTER — Other Ambulatory Visit: Payer: Self-pay

## 2021-08-30 ENCOUNTER — Ambulatory Visit (INDEPENDENT_AMBULATORY_CARE_PROVIDER_SITE_OTHER): Payer: No Typology Code available for payment source | Admitting: Obstetrics and Gynecology

## 2021-08-30 VITALS — BP 106/71 | HR 80 | Wt 130.4 lb

## 2021-08-30 DIAGNOSIS — Z3A15 15 weeks gestation of pregnancy: Secondary | ICD-10-CM

## 2021-08-30 DIAGNOSIS — Z641 Problems related to multiparity: Secondary | ICD-10-CM

## 2021-08-30 DIAGNOSIS — O09522 Supervision of elderly multigravida, second trimester: Secondary | ICD-10-CM

## 2021-08-30 LAB — POCT URINALYSIS DIPSTICK OB
Bilirubin, UA: NEGATIVE
Blood, UA: NEGATIVE
Glucose, UA: NEGATIVE
Ketones, UA: NEGATIVE
Leukocytes, UA: NEGATIVE
Nitrite, UA: NEGATIVE
POC,PROTEIN,UA: NEGATIVE
Spec Grav, UA: 1.02 (ref 1.010–1.025)
Urobilinogen, UA: 0.2 E.U./dL
pH, UA: 6.5 (ref 5.0–8.0)

## 2021-08-30 NOTE — Progress Notes (Signed)
ROB: No problems.  Taking vitamins and aspirin as directed.  Declines AFP today.  Anatomy ultrasound with next visit.  Improving with Augmentin-see note below. ?

## 2021-08-30 NOTE — Progress Notes (Signed)
ROB. Patient states she has a sinus infection at the time, being treated with Augmentin. She states no questions or concerns at this time.  ? ?

## 2021-08-31 ENCOUNTER — Encounter: Payer: No Typology Code available for payment source | Admitting: Obstetrics and Gynecology

## 2021-09-28 ENCOUNTER — Encounter: Payer: No Typology Code available for payment source | Admitting: Obstetrics and Gynecology

## 2021-09-28 ENCOUNTER — Ambulatory Visit: Payer: No Typology Code available for payment source

## 2021-09-28 DIAGNOSIS — Z3A19 19 weeks gestation of pregnancy: Secondary | ICD-10-CM

## 2021-09-28 DIAGNOSIS — O09522 Supervision of elderly multigravida, second trimester: Secondary | ICD-10-CM

## 2021-10-07 ENCOUNTER — Ambulatory Visit (INDEPENDENT_AMBULATORY_CARE_PROVIDER_SITE_OTHER): Payer: No Typology Code available for payment source

## 2021-10-07 ENCOUNTER — Encounter: Payer: Self-pay | Admitting: Obstetrics and Gynecology

## 2021-10-07 ENCOUNTER — Ambulatory Visit (INDEPENDENT_AMBULATORY_CARE_PROVIDER_SITE_OTHER): Payer: No Typology Code available for payment source | Admitting: Obstetrics and Gynecology

## 2021-10-07 VITALS — BP 103/67 | HR 62 | Wt 134.4 lb

## 2021-10-07 DIAGNOSIS — Z3482 Encounter for supervision of other normal pregnancy, second trimester: Secondary | ICD-10-CM | POA: Diagnosis not present

## 2021-10-07 DIAGNOSIS — Z3A21 21 weeks gestation of pregnancy: Secondary | ICD-10-CM

## 2021-10-07 DIAGNOSIS — Z3A15 15 weeks gestation of pregnancy: Secondary | ICD-10-CM | POA: Diagnosis not present

## 2021-10-07 DIAGNOSIS — Z369 Encounter for antenatal screening, unspecified: Secondary | ICD-10-CM

## 2021-10-07 DIAGNOSIS — O09522 Supervision of elderly multigravida, second trimester: Secondary | ICD-10-CM

## 2021-10-07 LAB — POCT URINALYSIS DIPSTICK OB
Bilirubin, UA: NEGATIVE
Blood, UA: NEGATIVE
Glucose, UA: NEGATIVE
Ketones, UA: NEGATIVE
Leukocytes, UA: NEGATIVE
Nitrite, UA: NEGATIVE
POC,PROTEIN,UA: NEGATIVE
Spec Grav, UA: 1.01 (ref 1.010–1.025)
Urobilinogen, UA: 0.2 E.U./dL
pH, UA: 7 (ref 5.0–8.0)

## 2021-10-07 NOTE — Progress Notes (Signed)
ROB. Patient states fetal movement no pain or pressure. Anatomy ultrasound preformed today. Patient states no questions or concerns at this time.  ? ?

## 2021-10-07 NOTE — Progress Notes (Signed)
ROB: Doing well no complaints.  Reports fetal movement.  Anatomy ultrasound today-normal. ?

## 2021-11-02 ENCOUNTER — Ambulatory Visit (INDEPENDENT_AMBULATORY_CARE_PROVIDER_SITE_OTHER): Payer: No Typology Code available for payment source | Admitting: Obstetrics and Gynecology

## 2021-11-02 ENCOUNTER — Encounter: Payer: Self-pay | Admitting: Obstetrics and Gynecology

## 2021-11-02 VITALS — BP 104/59 | HR 78 | Wt 144.5 lb

## 2021-11-02 DIAGNOSIS — Z131 Encounter for screening for diabetes mellitus: Secondary | ICD-10-CM

## 2021-11-02 DIAGNOSIS — O09522 Supervision of elderly multigravida, second trimester: Secondary | ICD-10-CM

## 2021-11-02 DIAGNOSIS — Z3A24 24 weeks gestation of pregnancy: Secondary | ICD-10-CM

## 2021-11-02 LAB — POCT URINALYSIS DIPSTICK OB
Bilirubin, UA: NEGATIVE
Blood, UA: NEGATIVE
Glucose, UA: NEGATIVE
Ketones, UA: NEGATIVE
Leukocytes, UA: NEGATIVE
Nitrite, UA: NEGATIVE
POC,PROTEIN,UA: NEGATIVE
Spec Grav, UA: 1.015 (ref 1.010–1.025)
Urobilinogen, UA: 0.2 E.U./dL
pH, UA: 6.5 (ref 5.0–8.0)

## 2021-11-02 NOTE — Patient Instructions (Signed)

## 2021-11-02 NOTE — Progress Notes (Signed)
ROB:She is doing well, has no new concerns.

## 2021-11-02 NOTE — Progress Notes (Signed)
ROB: Doing well, no complaints. RTC in 4 weeks, for 28 week labs then.

## 2021-11-30 ENCOUNTER — Encounter: Payer: Self-pay | Admitting: Obstetrics and Gynecology

## 2021-11-30 ENCOUNTER — Other Ambulatory Visit: Payer: No Typology Code available for payment source

## 2021-11-30 ENCOUNTER — Other Ambulatory Visit: Payer: Self-pay

## 2021-11-30 ENCOUNTER — Ambulatory Visit (INDEPENDENT_AMBULATORY_CARE_PROVIDER_SITE_OTHER): Payer: No Typology Code available for payment source | Admitting: Obstetrics and Gynecology

## 2021-11-30 VITALS — BP 102/63 | HR 79 | Wt 148.6 lb

## 2021-11-30 DIAGNOSIS — Z3A28 28 weeks gestation of pregnancy: Secondary | ICD-10-CM

## 2021-11-30 DIAGNOSIS — Z23 Encounter for immunization: Secondary | ICD-10-CM | POA: Diagnosis not present

## 2021-11-30 DIAGNOSIS — O09522 Supervision of elderly multigravida, second trimester: Secondary | ICD-10-CM

## 2021-11-30 DIAGNOSIS — Z131 Encounter for screening for diabetes mellitus: Secondary | ICD-10-CM

## 2021-11-30 LAB — POCT URINALYSIS DIPSTICK OB
Bilirubin, UA: NEGATIVE
Blood, UA: NEGATIVE
Glucose, UA: NEGATIVE
Ketones, UA: NEGATIVE
Leukocytes, UA: NEGATIVE
Nitrite, UA: NEGATIVE
Spec Grav, UA: 1.015 (ref 1.010–1.025)
Urobilinogen, UA: 0.2 E.U./dL
pH, UA: 7 (ref 5.0–8.0)

## 2021-11-30 NOTE — Progress Notes (Signed)
ROB. Patient states fetal movement with no pain or pressure. Patient completed GCT, received TDAP injection and signed BTC today. Patient states no questions or concerns at this time.

## 2021-11-30 NOTE — Progress Notes (Signed)
ROB: No complaints.  She is planning to travel to [place], trip precautions discussed.  1 hour GCT today.

## 2021-12-01 LAB — GLUCOSE, 1 HOUR GESTATIONAL: Gestational Diabetes Screen: 68 mg/dL — ABNORMAL LOW (ref 70–139)

## 2021-12-01 LAB — CBC
Hematocrit: 33.1 % — ABNORMAL LOW (ref 34.0–46.6)
Hemoglobin: 11.2 g/dL (ref 11.1–15.9)
MCH: 32.1 pg (ref 26.6–33.0)
MCHC: 33.8 g/dL (ref 31.5–35.7)
MCV: 95 fL (ref 79–97)
Platelets: 203 10*3/uL (ref 150–450)
RBC: 3.49 x10E6/uL — ABNORMAL LOW (ref 3.77–5.28)
RDW: 11.3 % — ABNORMAL LOW (ref 11.7–15.4)
WBC: 7.4 10*3/uL (ref 3.4–10.8)

## 2021-12-01 LAB — RPR: RPR Ser Ql: NONREACTIVE

## 2021-12-01 LAB — ANTIBODY SCREEN: Antibody Screen: NEGATIVE

## 2021-12-01 LAB — HIV ANTIBODY (ROUTINE TESTING W REFLEX): HIV Screen 4th Generation wRfx: NONREACTIVE

## 2021-12-05 IMAGING — MG MM DIGITAL SCREENING BILAT W/ TOMO AND CAD
6 of 10 series · 6 of 30 positions shown · non-contrast
Comparison: None.

CLINICAL DATA: Screening.

EXAM:
DIGITAL SCREENING BILATERAL MAMMOGRAM WITH TOMOSYNTHESIS AND CAD
TECHNIQUE: Bilateral screening digital craniocaudal and mediolateral oblique
mammograms were obtained. Bilateral screening digital breast
tomosynthesis was performed. The images were evaluated with
computer-aided detection.

[R CC synth-2D]
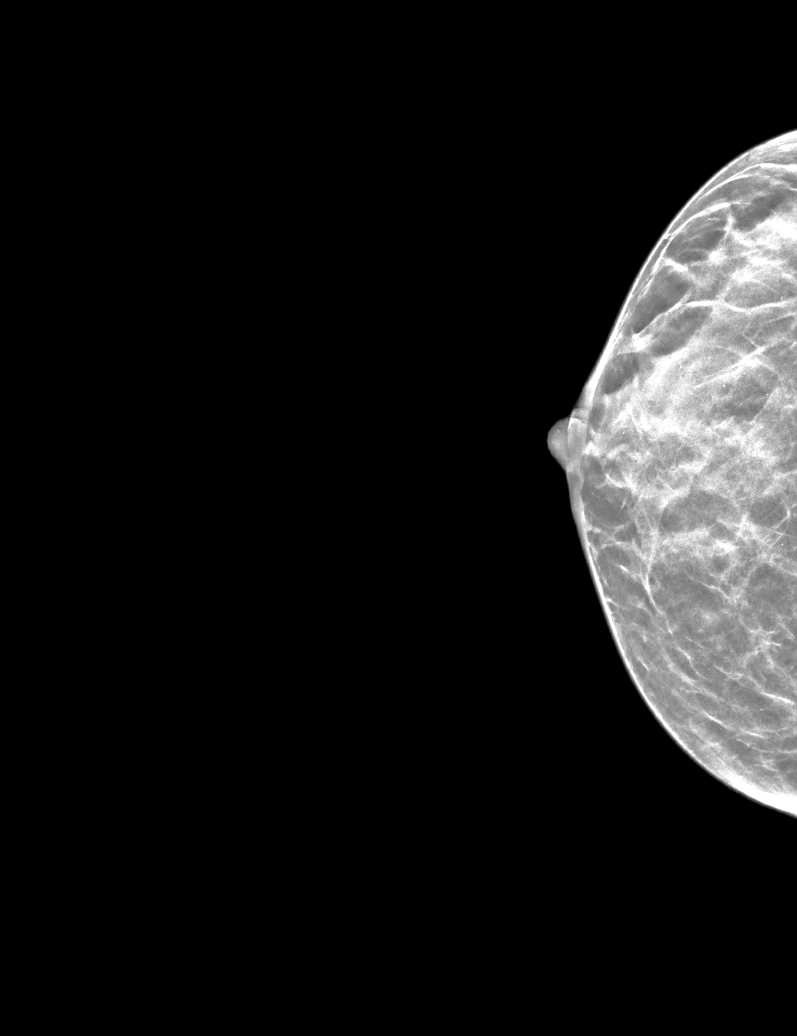

[R MLO synth-2D]
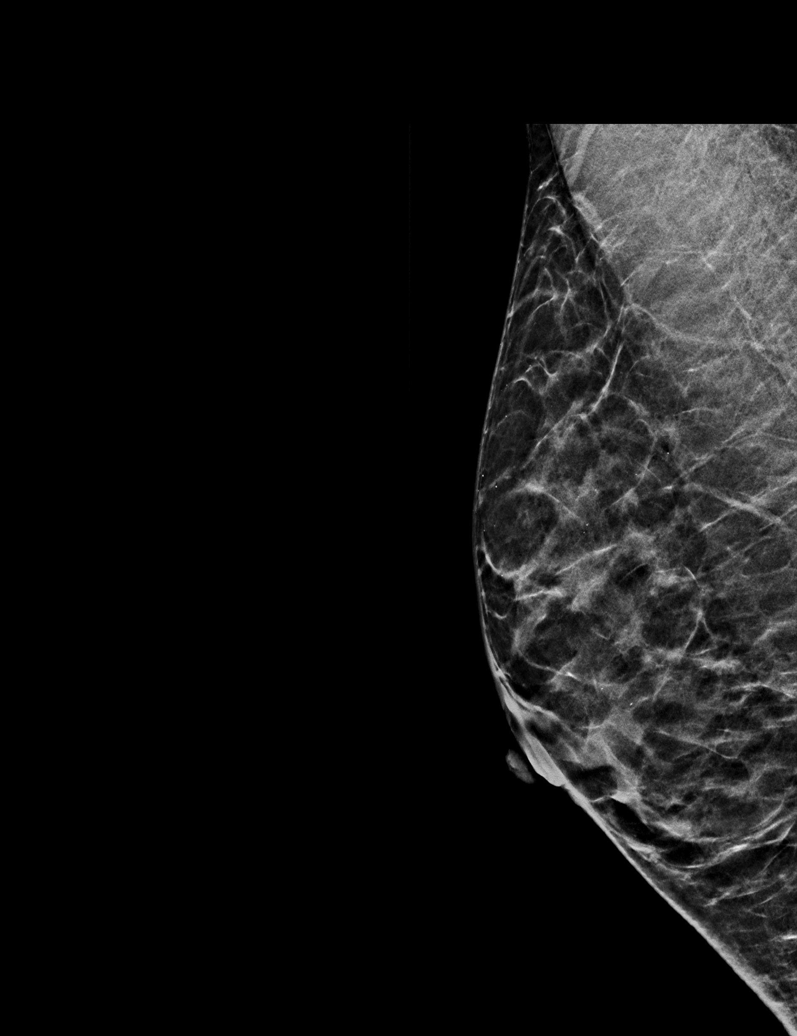

[L MLO synth-2D (1 of 2)]
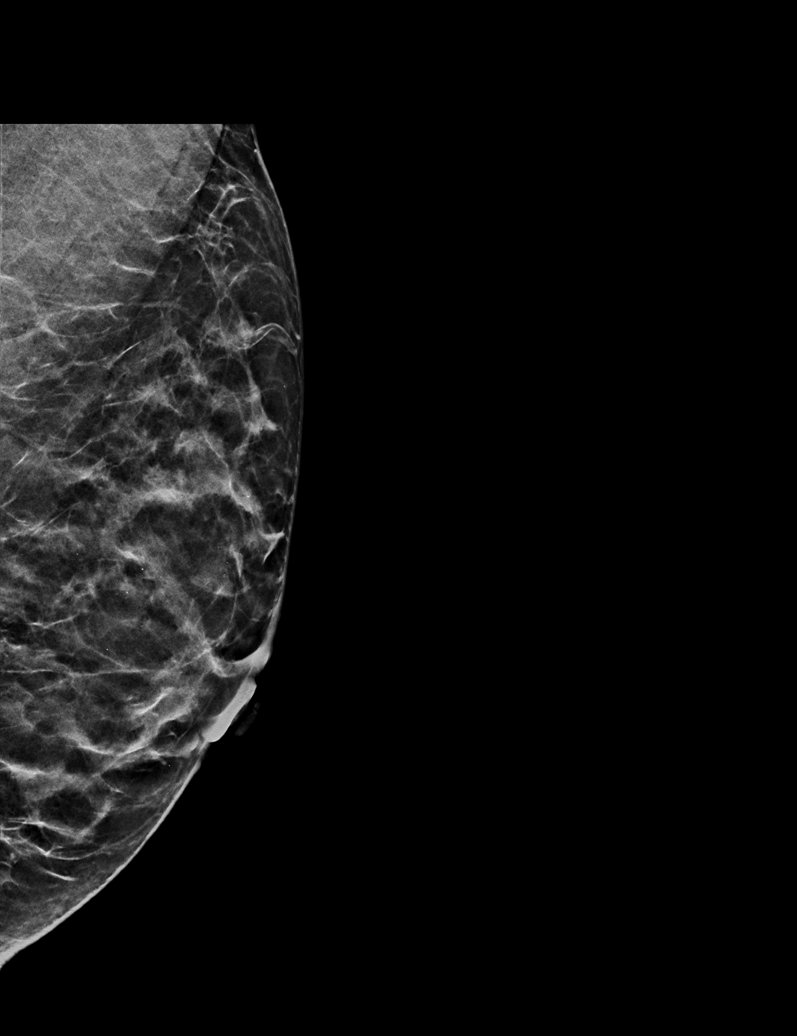

[L MLO synth-2D (2 of 2)]
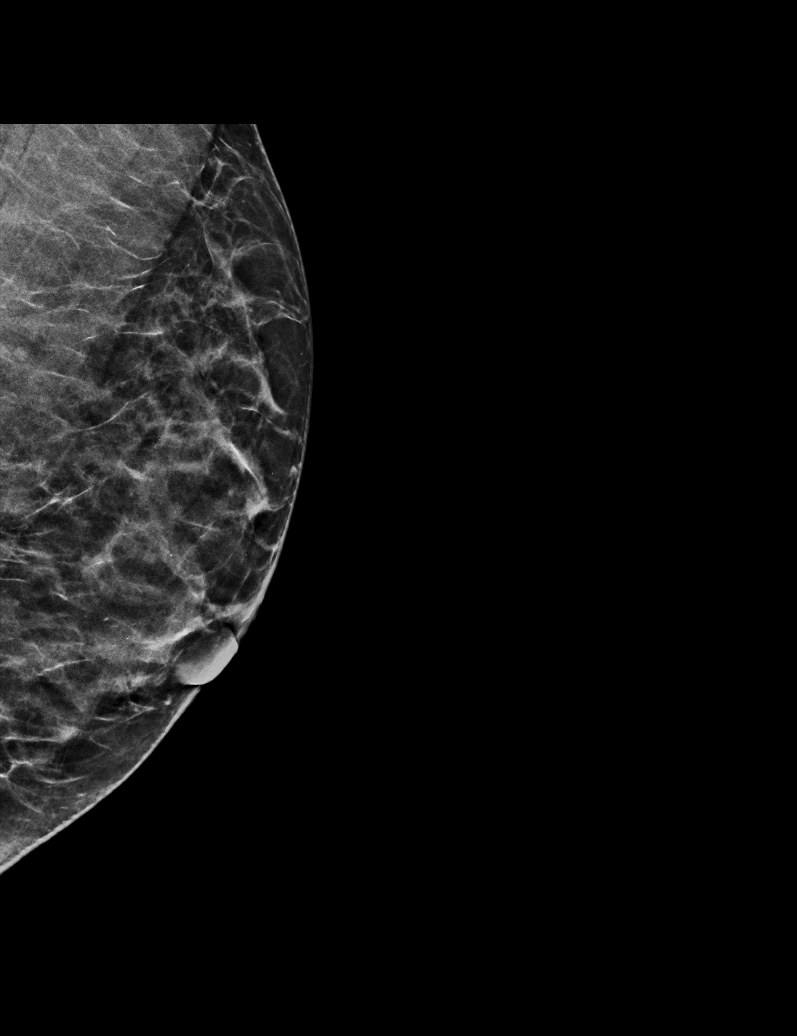

[L CC synth-2D]
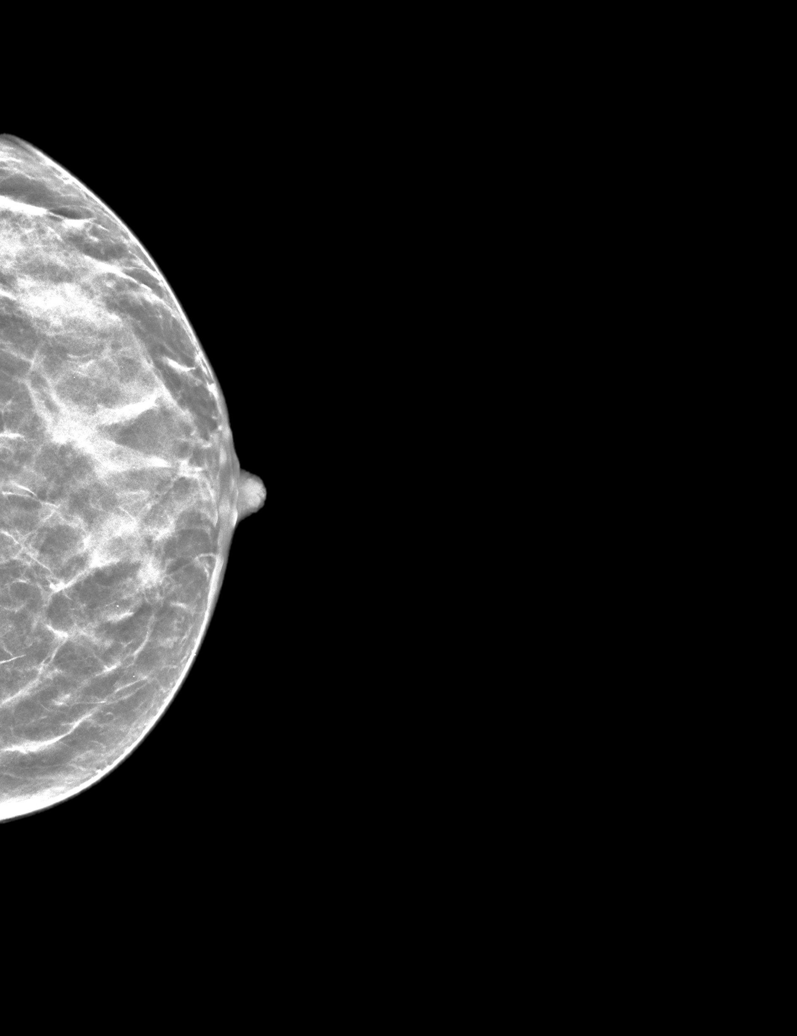

[R CC tomo · tomo slice 19/37.0]
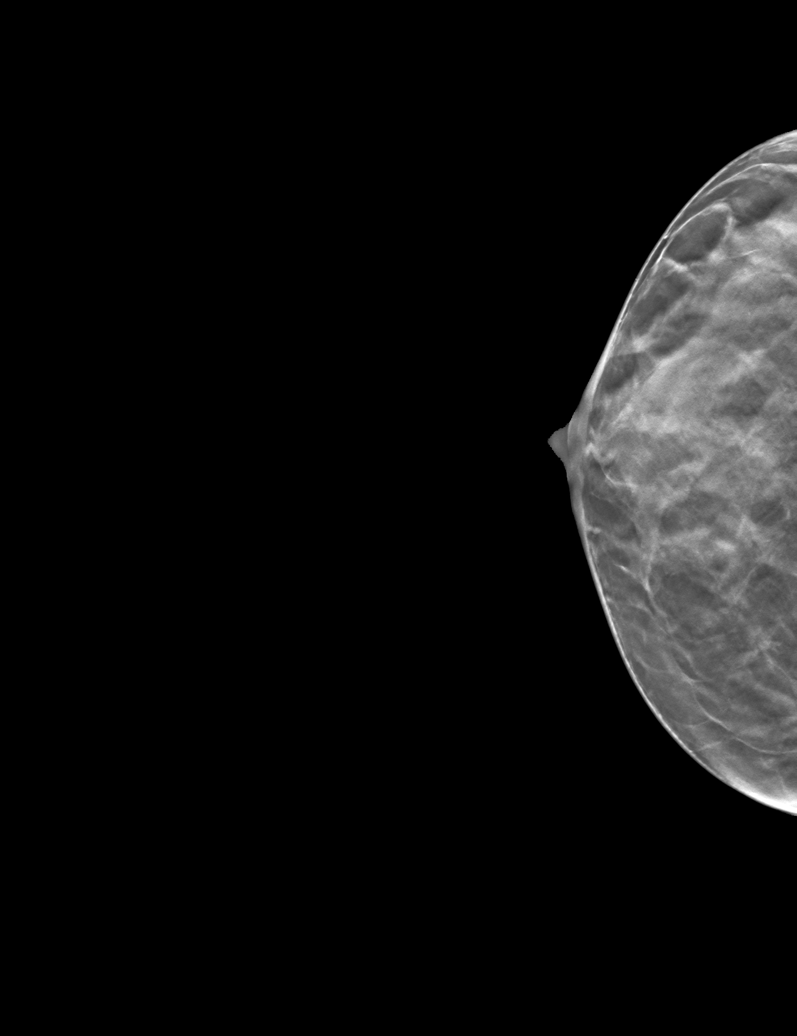

[6 of 30 positions shown; findings below may reference images not displayed]

ACR Breast Density Category c: The breast tissue is heterogeneously
dense, which may obscure small masses
FINDINGS: There are no findings suspicious for malignancy. The images were
evaluated with computer-aided detection.
IMPRESSION: No mammographic evidence of malignancy. A result letter of this
screening mammogram will be mailed directly to the patient.

RECOMMENDATION:
Screening mammogram in one year. (Code:[id number])

BI-RADS CATEGORY  1: Negative.

## 2021-12-23 ENCOUNTER — Encounter: Payer: Self-pay | Admitting: Obstetrics and Gynecology

## 2021-12-23 ENCOUNTER — Ambulatory Visit (INDEPENDENT_AMBULATORY_CARE_PROVIDER_SITE_OTHER): Payer: No Typology Code available for payment source | Admitting: Obstetrics and Gynecology

## 2021-12-23 VITALS — BP 102/50 | HR 88 | Wt 151.7 lb

## 2021-12-23 DIAGNOSIS — O09293 Supervision of pregnancy with other poor reproductive or obstetric history, third trimester: Secondary | ICD-10-CM

## 2021-12-23 DIAGNOSIS — O09522 Supervision of elderly multigravida, second trimester: Secondary | ICD-10-CM

## 2021-12-23 DIAGNOSIS — Z3A32 32 weeks gestation of pregnancy: Secondary | ICD-10-CM

## 2021-12-23 DIAGNOSIS — O09523 Supervision of elderly multigravida, third trimester: Secondary | ICD-10-CM

## 2021-12-23 LAB — POCT URINALYSIS DIPSTICK OB
Bilirubin, UA: NEGATIVE
Blood, UA: NEGATIVE
Glucose, UA: NEGATIVE
Ketones, UA: NEGATIVE
Leukocytes, UA: NEGATIVE
Nitrite, UA: NEGATIVE
Spec Grav, UA: 1.01 (ref 1.010–1.025)
Urobilinogen, UA: 0.2 E.U./dL
pH, UA: 7.5 (ref 5.0–8.0)

## 2021-12-23 NOTE — Progress Notes (Signed)
ROB: Patient doing well, no issues.  Changed PNV to ones containing iron due to mild anemia noted on labs. Plans to breastfeed. Plans for vasectomy for contraception. Discussed new practice model with midwifery as first call. Patient would prefer to remain in MD care.  RTC in 2 weeks. Discussed need to begin NSTs for AMA status at 36 weeks. Will also need growth scan at 6 weeks for h/o macrosomia (although currently fetus feels appropriate size).

## 2021-12-23 NOTE — Progress Notes (Signed)
ROB: She is doing well, no new concerns.

## 2022-01-09 ENCOUNTER — Telehealth: Payer: Self-pay | Admitting: Obstetrics and Gynecology

## 2022-01-09 NOTE — Telephone Encounter (Signed)
Patient had slight mucusy discharge on Friday. She called the triage line and was wanting to speak with a nurse to see if she needed to be seen earlier.   Cb# [phone]

## 2022-01-09 NOTE — Telephone Encounter (Signed)
Patient called in to state mucous discharge. She states last Friday she had mucous discharge tinged with blood. She states no bleeding, no further abnormal discharge, fetal movement is present. Advised patient to let office know if bleeding begins or she has any other concerns. Confirmed patient will be here 8/2 for appointment.

## 2022-01-11 ENCOUNTER — Encounter: Payer: Self-pay | Admitting: Obstetrics and Gynecology

## 2022-01-11 ENCOUNTER — Ambulatory Visit (INDEPENDENT_AMBULATORY_CARE_PROVIDER_SITE_OTHER): Payer: No Typology Code available for payment source | Admitting: Obstetrics and Gynecology

## 2022-01-11 VITALS — BP 118/71 | HR 76 | Wt 156.6 lb

## 2022-01-11 DIAGNOSIS — Z3A34 34 weeks gestation of pregnancy: Secondary | ICD-10-CM

## 2022-01-11 DIAGNOSIS — O09523 Supervision of elderly multigravida, third trimester: Secondary | ICD-10-CM

## 2022-01-11 LAB — POCT URINALYSIS DIPSTICK OB
Bilirubin, UA: NEGATIVE
Blood, UA: NEGATIVE
Glucose, UA: NEGATIVE
Ketones, UA: NEGATIVE
Leukocytes, UA: NEGATIVE
Nitrite, UA: NEGATIVE
POC,PROTEIN,UA: NEGATIVE
Spec Grav, UA: 1.01 (ref 1.010–1.025)
Urobilinogen, UA: 0.2 E.U./dL
pH, UA: 7 (ref 5.0–8.0)

## 2022-01-11 NOTE — Progress Notes (Signed)
ROB: Reports daily fetal movement.  Having irregular mild contractions.  Thinks the baby has dropped and she lost some of her mucous plug.  Plan NST next visit for AMA.  Cultures next visit.

## 2022-01-11 NOTE — Progress Notes (Signed)
ROB. Patient states fetal movement with increased braxton hicks and pressure towards the end of the day. Continued use of prenatals with iron and daily ASA. Patient states no questions or concerns at this time.

## 2022-01-18 ENCOUNTER — Ambulatory Visit (INDEPENDENT_AMBULATORY_CARE_PROVIDER_SITE_OTHER): Payer: No Typology Code available for payment source | Admitting: Obstetrics and Gynecology

## 2022-01-18 ENCOUNTER — Encounter: Payer: Self-pay | Admitting: Obstetrics and Gynecology

## 2022-01-18 ENCOUNTER — Other Ambulatory Visit (HOSPITAL_COMMUNITY)
Admission: RE | Admit: 2022-01-18 | Discharge: 2022-01-18 | Disposition: A | Discharge status: Home / Self Care | Payer: No Typology Code available for payment source | Source: Ambulatory Visit | Attending: Obstetrics and Gynecology | Admitting: Obstetrics and Gynecology

## 2022-01-18 ENCOUNTER — Ambulatory Visit: Payer: No Typology Code available for payment source

## 2022-01-18 ENCOUNTER — Ambulatory Visit (INDEPENDENT_AMBULATORY_CARE_PROVIDER_SITE_OTHER): Payer: No Typology Code available for payment source

## 2022-01-18 VITALS — BP 101/63 | HR 72 | Wt 157.1 lb

## 2022-01-18 DIAGNOSIS — Z3A35 35 weeks gestation of pregnancy: Secondary | ICD-10-CM | POA: Diagnosis present

## 2022-01-18 DIAGNOSIS — O09523 Supervision of elderly multigravida, third trimester: Secondary | ICD-10-CM

## 2022-01-18 DIAGNOSIS — O09293 Supervision of pregnancy with other poor reproductive or obstetric history, third trimester: Secondary | ICD-10-CM | POA: Diagnosis not present

## 2022-01-18 DIAGNOSIS — Z113 Encounter for screening for infections with a predominantly sexual mode of transmission: Secondary | ICD-10-CM | POA: Diagnosis present

## 2022-01-18 DIAGNOSIS — Z3685 Encounter for antenatal screening for Streptococcus B: Secondary | ICD-10-CM

## 2022-01-18 LAB — POCT URINALYSIS DIPSTICK OB
Bilirubin, UA: NEGATIVE
Blood, UA: NEGATIVE
Glucose, UA: NEGATIVE
Ketones, UA: NEGATIVE
Leukocytes, UA: NEGATIVE
Nitrite, UA: NEGATIVE
POC,PROTEIN,UA: NEGATIVE
Spec Grav, UA: 1.01 (ref 1.010–1.025)
Urobilinogen, UA: 0.2 E.U./dL
pH, UA: 7 (ref 5.0–8.0)

## 2022-01-18 NOTE — Progress Notes (Signed)
ROB: Doing well, no issues. 3rd trimester cultures done today. Growth scan normal today, 56%ile, normal AFI.  NST performed today was reviewed and was found to be reactive.  Continue recommended antenatal testing and prenatal care.   NONSTRESS TEST INTERPRETATION  INDICATIONS:  AMA  FHR baseline: 150 bpm RESULTS:Reactive COMMENTS: Uterine irritability with occasional contraction.    PLAN: 1. Continue fetal kick counts twice a day. 2. Continue antepartum testing as scheduled-weekly

## 2022-01-18 NOTE — Progress Notes (Signed)
ROB: She is doing well. No new concerns. NST and cultures today.

## 2022-01-18 NOTE — Addendum Note (Signed)
Addended by: Chilton Greathouse on: 01/18/2022 02:24 PM   Modules accepted: Orders

## 2022-01-20 LAB — STREP GP B NAA: Strep Gp B NAA: NEGATIVE

## 2022-01-20 LAB — CERVICOVAGINAL ANCILLARY ONLY
Chlamydia: NEGATIVE
Comment: NEGATIVE
Comment: NORMAL
Neisseria Gonorrhea: NEGATIVE

## 2022-01-23 ENCOUNTER — Other Ambulatory Visit: Payer: No Typology Code available for payment source

## 2022-01-23 ENCOUNTER — Encounter: Payer: Self-pay | Admitting: Obstetrics and Gynecology

## 2022-01-25 ENCOUNTER — Encounter: Payer: Self-pay | Admitting: Obstetrics and Gynecology

## 2022-01-25 ENCOUNTER — Encounter: Payer: No Typology Code available for payment source | Admitting: Obstetrics and Gynecology

## 2022-01-25 ENCOUNTER — Other Ambulatory Visit: Payer: No Typology Code available for payment source

## 2022-01-25 ENCOUNTER — Ambulatory Visit (INDEPENDENT_AMBULATORY_CARE_PROVIDER_SITE_OTHER): Payer: No Typology Code available for payment source | Admitting: Obstetrics and Gynecology

## 2022-01-25 VITALS — BP 106/66 | HR 77 | Wt 158.1 lb

## 2022-01-25 DIAGNOSIS — O09523 Supervision of elderly multigravida, third trimester: Secondary | ICD-10-CM | POA: Diagnosis not present

## 2022-01-25 DIAGNOSIS — Z3A36 36 weeks gestation of pregnancy: Secondary | ICD-10-CM

## 2022-01-25 NOTE — Progress Notes (Signed)
ROB. Patient states daily fetal movement with occasional pressure. NST preformed today. Patient states no questions or concerns at this time.

## 2022-01-25 NOTE — Progress Notes (Signed)
ROB: NST reactive.  No complaints.  Occasional [address].  Cultures negative.

## 2022-01-26 ENCOUNTER — Telehealth: Payer: Self-pay | Admitting: Obstetrics and Gynecology

## 2022-01-26 NOTE — Telephone Encounter (Signed)
Called patient in reference to her FMLA. No answer. Left VM with patient to call the office for further processing.

## 2022-02-01 ENCOUNTER — Ambulatory Visit (INDEPENDENT_AMBULATORY_CARE_PROVIDER_SITE_OTHER): Payer: No Typology Code available for payment source | Admitting: Obstetrics and Gynecology

## 2022-02-01 ENCOUNTER — Other Ambulatory Visit: Payer: No Typology Code available for payment source

## 2022-02-01 ENCOUNTER — Encounter: Payer: Self-pay | Admitting: Obstetrics and Gynecology

## 2022-02-01 VITALS — BP 99/63 | HR 73 | Wt 156.3 lb

## 2022-02-01 DIAGNOSIS — O09523 Supervision of elderly multigravida, third trimester: Secondary | ICD-10-CM | POA: Diagnosis not present

## 2022-02-01 DIAGNOSIS — Z3A37 37 weeks gestation of pregnancy: Secondary | ICD-10-CM

## 2022-02-01 LAB — POCT URINALYSIS DIPSTICK OB
Bilirubin, UA: NEGATIVE
Blood, UA: NEGATIVE
Glucose, UA: NEGATIVE
Ketones, UA: NEGATIVE
Leukocytes, UA: NEGATIVE
Nitrite, UA: NEGATIVE
POC,PROTEIN,UA: NEGATIVE
Spec Grav, UA: 1.01 (ref 1.010–1.025)
Urobilinogen, UA: 0.2 E.U./dL
pH, UA: 7.5 (ref 5.0–8.0)

## 2022-02-01 NOTE — Progress Notes (Signed)
ROB. Patient reports daily fetal movement. NST preformed today. Patient states no questions or concerns at this time.

## 2022-02-01 NOTE — Progress Notes (Signed)
ROB: No complaints.  States that she has irregular mild contractions.  Not feeling contractions today.  Reports daily fetal movement.

## 2022-02-07 ENCOUNTER — Encounter: Payer: Self-pay | Admitting: Obstetrics and Gynecology

## 2022-02-08 ENCOUNTER — Encounter: Payer: Self-pay | Admitting: Obstetrics and Gynecology

## 2022-02-08 ENCOUNTER — Other Ambulatory Visit: Payer: No Typology Code available for payment source

## 2022-02-08 ENCOUNTER — Ambulatory Visit (INDEPENDENT_AMBULATORY_CARE_PROVIDER_SITE_OTHER): Payer: No Typology Code available for payment source | Admitting: Obstetrics and Gynecology

## 2022-02-08 VITALS — BP 104/58 | HR 71 | Wt 157.8 lb

## 2022-02-08 DIAGNOSIS — O09293 Supervision of pregnancy with other poor reproductive or obstetric history, third trimester: Secondary | ICD-10-CM

## 2022-02-08 DIAGNOSIS — Z3A38 38 weeks gestation of pregnancy: Secondary | ICD-10-CM

## 2022-02-08 DIAGNOSIS — O09523 Supervision of elderly multigravida, third trimester: Secondary | ICD-10-CM | POA: Diagnosis not present

## 2022-02-08 DIAGNOSIS — O479 False labor, unspecified: Secondary | ICD-10-CM

## 2022-02-08 LAB — POCT URINALYSIS DIPSTICK OB
Bilirubin, UA: NEGATIVE
Blood, UA: NEGATIVE
Glucose, UA: NEGATIVE
Ketones, UA: NEGATIVE
Leukocytes, UA: NEGATIVE
Nitrite, UA: NEGATIVE
Spec Grav, UA: 1.015 (ref 1.010–1.025)
Urobilinogen, UA: 0.2 E.U./dL
pH, UA: 7.5 (ref 5.0–8.0)

## 2022-02-08 NOTE — Progress Notes (Signed)
ROB: Patient doing well, no issues. Does note some occasional ctx, but not painful.  Discussed IOL if patient proceeds beyond due date. Notes with last pregnancies she was allowed to proceed beyond 40 weeks prior to needing IOL, would prefer the same for current pregnancy. Exam deferred today, can perform at next visit. NST performed today was reviewed and was found to be reactive.  Continue recommended antenatal testing and prenatal care.    NONSTRESS TEST INTERPRETATION  INDICATIONS:  AMA  FHR baseline: 135 bpm RESULTS:Reactive COMMENTS: rare contraction   PLAN: 1. Continue fetal kick counts twice a day. 2. Continue antepartum testing as scheduled-weekly    Rubie Maid, MD [facility]

## 2022-02-08 NOTE — Progress Notes (Signed)
ROB: She is doing well. NST done today.

## 2022-02-16 ENCOUNTER — Other Ambulatory Visit: Payer: No Typology Code available for payment source

## 2022-02-16 ENCOUNTER — Encounter: Payer: Self-pay | Admitting: Obstetrics and Gynecology

## 2022-02-16 ENCOUNTER — Ambulatory Visit (INDEPENDENT_AMBULATORY_CARE_PROVIDER_SITE_OTHER): Payer: No Typology Code available for payment source | Admitting: Obstetrics and Gynecology

## 2022-02-16 VITALS — BP 102/68 | HR 76 | Wt 155.9 lb

## 2022-02-16 DIAGNOSIS — O09523 Supervision of elderly multigravida, third trimester: Secondary | ICD-10-CM | POA: Diagnosis not present

## 2022-02-16 DIAGNOSIS — Z3A40 40 weeks gestation of pregnancy: Secondary | ICD-10-CM

## 2022-02-16 LAB — POCT URINALYSIS DIPSTICK OB
Bilirubin, UA: NEGATIVE
Blood, UA: NEGATIVE
Glucose, UA: NEGATIVE
Ketones, UA: NEGATIVE
Leukocytes, UA: NEGATIVE
Nitrite, UA: NEGATIVE
Spec Grav, UA: 1.01 (ref 1.010–1.025)
Urobilinogen, UA: 0.2 E.U./dL
pH, UA: 8 (ref 5.0–8.0)

## 2022-02-16 NOTE — Progress Notes (Signed)
ROB. Patient states daily fetal movement with braxton hicks, no pain or pressure. NST preformed today. Patient states no questions or concerns at this time.

## 2022-02-16 NOTE — Addendum Note (Signed)
Addended by: Finis Bud on: 02/16/2022 10:26 AM   Modules accepted: Orders

## 2022-02-16 NOTE — Progress Notes (Signed)
ROB: Has irregular contractions.  Reports daily fetal movement.  NST reactive today.  Induction for postdates scheduled next Wednesday 5 AM.  Plan Pitocin or Miso and AROM.  Signs and symptoms of labor discussed.

## 2022-02-20 ENCOUNTER — Encounter: Payer: Self-pay | Admitting: Obstetrics and Gynecology

## 2022-02-22 ENCOUNTER — Inpatient Hospital Stay: Payer: No Typology Code available for payment source | Admitting: Anesthesiology

## 2022-02-22 ENCOUNTER — Other Ambulatory Visit: Payer: Self-pay

## 2022-02-22 ENCOUNTER — Encounter: Payer: Self-pay | Admitting: Obstetrics and Gynecology

## 2022-02-22 ENCOUNTER — Inpatient Hospital Stay
Admission: EM | Admit: 2022-02-22 | Discharge: 2022-02-23 | DRG: 807 | Disposition: A | Discharge status: Home / Self Care | Payer: No Typology Code available for payment source | Attending: Obstetrics and Gynecology | Admitting: Obstetrics and Gynecology

## 2022-02-22 DIAGNOSIS — O480 Post-term pregnancy: Secondary | ICD-10-CM | POA: Diagnosis present

## 2022-02-22 DIAGNOSIS — O09523 Supervision of elderly multigravida, third trimester: Secondary | ICD-10-CM | POA: Diagnosis present

## 2022-02-22 DIAGNOSIS — Z3A40 40 weeks gestation of pregnancy: Secondary | ICD-10-CM

## 2022-02-22 DIAGNOSIS — O09293 Supervision of pregnancy with other poor reproductive or obstetric history, third trimester: Secondary | ICD-10-CM

## 2022-02-22 DIAGNOSIS — N96 Recurrent pregnancy loss: Secondary | ICD-10-CM | POA: Diagnosis present

## 2022-02-22 LAB — CBC
HCT: 37.2 % (ref 36.0–46.0)
Hemoglobin: 12.6 g/dL (ref 12.0–15.0)
MCH: 32.2 pg (ref 26.0–34.0)
MCHC: 33.9 g/dL (ref 30.0–36.0)
MCV: 95.1 fL (ref 80.0–100.0)
Platelets: 154 10*3/uL (ref 150–400)
RBC: 3.91 MIL/uL (ref 3.87–5.11)
RDW: 12.7 % (ref 11.5–15.5)
WBC: 6.6 10*3/uL (ref 4.0–10.5)
nRBC: 0 % (ref 0.0–0.2)

## 2022-02-22 LAB — TYPE AND SCREEN
ABO/RH(D): O POS
Antibody Screen: NEGATIVE

## 2022-02-22 LAB — RPR: RPR Ser Ql: NONREACTIVE

## 2022-02-22 LAB — ABO/RH: ABO/RH(D): O POS

## 2022-02-22 MED ORDER — MISOPROSTOL 50MCG HALF TABLET: 50.0000 ug | ORAL_TABLET | ORAL | Status: DC | PRN

## 2022-02-22 MED ORDER — WITCH HAZEL-GLYCERIN EX PADS
1.0000 | MEDICATED_PAD | CUTANEOUS | Status: DC | PRN
  Administered 2022-02-22: 1 via TOPICAL
  Filled 2022-02-22: qty 100

## 2022-02-22 MED ORDER — OXYTOCIN BOLUS FROM INFUSION
333.0000 mL | Freq: Once | INTRAVENOUS | Status: AC
  Administered 2022-02-22: 333 mL via INTRAVENOUS

## 2022-02-22 MED ORDER — SOD CITRATE-CITRIC ACID 500-334 MG/5ML PO SOLN: 30.0000 mL | ORAL | Status: DC | PRN

## 2022-02-22 MED ORDER — ACETAMINOPHEN 325 MG PO TABS: 650.0000 mg | ORAL_TABLET | ORAL | Status: DC | PRN

## 2022-02-22 MED ORDER — LIDOCAINE HCL (PF) 1 % IJ SOLN: 30.0000 mL | INTRAMUSCULAR | Status: DC | PRN

## 2022-02-22 MED ORDER — LIDOCAINE HCL (PF) 1 % IJ SOLN
INTRAMUSCULAR | Status: DC | PRN
  Administered 2022-02-22: 3 mL

## 2022-02-22 MED ORDER — PRENATAL MULTIVITAMIN CH
1.0000 | ORAL_TABLET | Freq: Every day | ORAL | Status: DC
  Administered 2022-02-23: 1 via ORAL
  Filled 2022-02-22: qty 1

## 2022-02-22 MED ORDER — TERBUTALINE SULFATE 1 MG/ML IJ SOLN: 0.2500 mg | Freq: Once | INTRAMUSCULAR | Status: DC | PRN

## 2022-02-22 MED ORDER — ONDANSETRON HCL 4 MG/2ML IJ SOLN: 4.0000 mg | Freq: Four times a day (QID) | INTRAMUSCULAR | Status: DC | PRN

## 2022-02-22 MED ORDER — LACTATED RINGERS IV SOLN
500.0000 mL | INTRAVENOUS | Status: DC | PRN
  Administered 2022-02-22: 500 mL via INTRAVENOUS

## 2022-02-22 MED ORDER — BENZOCAINE-MENTHOL 20-0.5 % EX AERO: 1.0000 | INHALATION_SPRAY | CUTANEOUS | Status: DC | PRN

## 2022-02-22 MED ORDER — OXYTOCIN-SODIUM CHLORIDE 30-0.9 UT/500ML-% IV SOLN
1.0000 m[IU]/min | INTRAVENOUS | Status: DC
  Administered 2022-02-22: 4 m[IU]/min via INTRAVENOUS

## 2022-02-22 MED ORDER — EPHEDRINE 5 MG/ML INJ
10.0000 mg | INTRAVENOUS | Status: DC | PRN
  Filled 2022-02-22: qty 5

## 2022-02-22 MED ORDER — LACTATED RINGERS IV SOLN
500.0000 mL | Freq: Once | INTRAVENOUS | Status: AC
  Administered 2022-02-22: 500 mL via INTRAVENOUS

## 2022-02-22 MED ORDER — SENNOSIDES-DOCUSATE SODIUM 8.6-50 MG PO TABS
2.0000 | ORAL_TABLET | Freq: Every day | ORAL | Status: DC
  Administered 2022-02-23: 1 via ORAL
  Filled 2022-02-22: qty 2

## 2022-02-22 MED ORDER — ONDANSETRON HCL 4 MG PO TABS: 4.0000 mg | ORAL_TABLET | ORAL | Status: DC | PRN

## 2022-02-22 MED ORDER — ACETAMINOPHEN 325 MG PO TABS
650.0000 mg | ORAL_TABLET | ORAL | Status: DC | PRN
  Administered 2022-02-23: 650 mg via ORAL
  Filled 2022-02-22 (×2): qty 2

## 2022-02-22 MED ORDER — ONDANSETRON HCL 4 MG/2ML IJ SOLN: 4.0000 mg | INTRAMUSCULAR | Status: DC | PRN

## 2022-02-22 MED ORDER — FENTANYL-BUPIVACAINE-NACL 0.5-0.125-0.9 MG/250ML-% EP SOLN
12.0000 mL/h | EPIDURAL | Status: DC | PRN
  Administered 2022-02-22: 12 mL/h via EPIDURAL

## 2022-02-22 MED ORDER — IBUPROFEN 600 MG PO TABS: 600.0000 mg | ORAL_TABLET | Freq: Four times a day (QID) | ORAL | Status: DC

## 2022-02-22 MED ORDER — DIPHENHYDRAMINE HCL 50 MG/ML IJ SOLN: 12.5000 mg | INTRAMUSCULAR | Status: DC | PRN

## 2022-02-22 MED ORDER — OXYTOCIN-SODIUM CHLORIDE 30-0.9 UT/500ML-% IV SOLN
2.5000 [IU]/h | INTRAVENOUS | Status: DC
  Administered 2022-02-22: 2.5 [IU]/h via INTRAVENOUS
  Filled 2022-02-22: qty 500

## 2022-02-22 MED ORDER — DIBUCAINE (PERIANAL) 1 % EX OINT
1.0000 | TOPICAL_OINTMENT | CUTANEOUS | Status: DC | PRN
  Administered 2022-02-22: 1 via RECTAL
  Filled 2022-02-22: qty 28

## 2022-02-22 MED ORDER — PHENYLEPHRINE 80 MCG/ML (10ML) SYRINGE FOR IV PUSH (FOR BLOOD PRESSURE SUPPORT): 80.0000 ug | PREFILLED_SYRINGE | INTRAVENOUS | Status: DC | PRN

## 2022-02-22 MED ORDER — FENTANYL-BUPIVACAINE-NACL 0.5-0.125-0.9 MG/250ML-% EP SOLN
EPIDURAL | Status: AC
  Filled 2022-02-22: qty 250

## 2022-02-22 MED ORDER — EPHEDRINE 5 MG/ML INJ
10.0000 mg | INTRAVENOUS | Status: AC | PRN
  Administered 2022-02-22 (×2): 10 mg via INTRAVENOUS

## 2022-02-22 MED ORDER — ZOLPIDEM TARTRATE 5 MG PO TABS: 5.0000 mg | ORAL_TABLET | Freq: Every evening | ORAL | Status: DC | PRN

## 2022-02-22 MED ORDER — COCONUT OIL OIL: 1.0000 | TOPICAL_OIL | Status: DC | PRN

## 2022-02-22 MED ORDER — SIMETHICONE 80 MG PO CHEW: 80.0000 mg | CHEWABLE_TABLET | ORAL | Status: DC | PRN

## 2022-02-22 MED ORDER — LACTATED RINGERS IV SOLN: INTRAVENOUS | Status: DC

## 2022-02-22 MED ORDER — BUPIVACAINE HCL (PF) 0.25 % IJ SOLN
INTRAMUSCULAR | Status: DC | PRN
  Administered 2022-02-22 (×2): 4 mL via EPIDURAL

## 2022-02-22 MED ORDER — DIPHENHYDRAMINE HCL 25 MG PO CAPS: 25.0000 mg | ORAL_CAPSULE | Freq: Four times a day (QID) | ORAL | Status: DC | PRN

## 2022-02-22 MED ORDER — LIDOCAINE-EPINEPHRINE (PF) 1.5 %-1:200000 IJ SOLN
INTRAMUSCULAR | Status: DC | PRN
  Administered 2022-02-22: 4 mL via PERINEURAL

## 2022-02-22 NOTE — Anesthesia Preprocedure Evaluation (Addendum)
Anesthesia Evaluation  Patient identified by MRN, date of birth, ID band Patient awake    History of Anesthesia Complications Negative for: history of anesthetic complications  Airway Mallampati: II  TM Distance: >3 FB Neck ROM: Full    Dental no notable dental hx.    Pulmonary neg pulmonary ROS,           Cardiovascular Exercise Tolerance: Good negative cardio ROS       Neuro/Psych negative neurological ROS  negative psych ROS   GI/Hepatic Neg liver ROS, PUD, GERD  ,  Endo/Other  negative endocrine ROS  Renal/GU negative Renal ROS  negative genitourinary   Musculoskeletal negative musculoskeletal ROS (+)   Abdominal   Peds negative pediatric ROS (+)  Hematology negative hematology ROS (+)   Anesthesia Other Findings   Reproductive/Obstetrics (+) Pregnancy                            Anesthesia Physical Anesthesia Plan  ASA: 2  Anesthesia Plan: Epidural   Post-op Pain Management:    Induction:   PONV Risk Score and Plan:   Airway Management Planned:   Additional Equipment:   Intra-op Plan:   Post-operative Plan:   Informed Consent: I have reviewed the patients History and Physical, chart, labs and discussed the procedure including the risks, benefits and alternatives for the proposed anesthesia with the patient or authorized representative who has indicated his/her understanding and acceptance.       Plan Discussed with: Anesthesiologist  Anesthesia Plan Comments:         Anesthesia Quick Evaluation

## 2022-02-22 NOTE — Anesthesia Procedure Notes (Signed)
Epidural Patient location during procedure: OB Start time: 02/22/2022 9:50 AM  Staffing Resident/CRNA: Aline Brochure, CRNA Performed: resident/CRNA   Preanesthetic Checklist Completed: patient identified, IV checked, site marked, risks and benefits discussed, surgical consent, monitors and equipment checked, pre-op evaluation and timeout performed  Epidural Patient position: sitting Prep: ChloraPrep Patient monitoring: heart rate, continuous pulse ox and blood pressure Approach: midline Location: L3-L4 Injection technique: LOR saline  Needle:  Needle type: Tuohy  Needle gauge: 17 G Needle length: 9 cm Needle insertion depth: 5 cm Catheter type: closed end flexible Catheter size: 19 Gauge Catheter at skin depth: 10 cm Test dose: negative and 1.5% lidocaine with Epi 1:200 K  Assessment Events: blood not aspirated, injection not painful, no injection resistance, no paresthesia and negative IV test  Additional Notes Pt tolerated well.  Atraumatic X1.

## 2022-02-22 NOTE — Progress Notes (Signed)
Pt Lindsay Owens G7P5 had successful vaginal delivery at 1224 . Pt recovered well in 2 hour recovery and pain managed. Pt to postpartum unit for couplet care.

## 2022-02-22 NOTE — Lactation Note (Signed)
This note was copied from a baby's chart. Lactation Consultation Note  Patient Name: Lindsay Owens [id number]'U Date: 02/22/2022 Reason for consult: Initial assessment;Term Age:42 hours  Maternal Data Has patient been taught Hand Expression?: Yes Does the patient have breastfeeding experience prior to this delivery?: Yes How long did the patient breastfeed?: 9yr  P5, SVD 4 hours ago. Experienced breastfeeding with all other children; no overall concerns or problems voiced. Mom is a [employer] we discussed pump options that are available.  Pump to be given before discharge.  Feeding Mother's Current Feeding Choice: Breast Milk  LATCH Score Latch: Grasps breast easily, tongue down, lips flanged, rhythmical sucking.  Audible Swallowing: Spontaneous and intermittent  Type of Nipple: Everted at rest and after stimulation  Comfort (Breast/Nipple): Soft / non-tender  Hold (Positioning): No assistance needed to correctly position infant at breast.  LATCH Score: 10   Lactation Tools Discussed/Used    Interventions Interventions: Breast feeding basics reviewed;Hand express;Education  Brief education given on normal newborn feeding patterns/behaviors, early cues, feeding on demand, hand expression, and skin to skin to aid in transition.  Discharge Pump: Employee Pump (Will give before discharge)  Consult Status Consult Status: PRN    SLavonia Drafts9/13/2023, 4:52 PM

## 2022-02-22 NOTE — H&P (Signed)
Obstetric History and Physical  Lindsay Owens is a 41 y.o. [id number] with IUP at 35w6dpresenting for scheduled IOl for postdates pregnancy, also AMA, h/o 2 prior miscarriages. Patient states she has been having  irregular non-painful contractions,  no  vaginal bleeding, intact membranes, with active fetal movement.    Prenatal Course Source of Care: [facility] with onset of care at 6 weeks Pregnancy complications or risks: Patient Active Problem List   Diagnosis Date Noted   Post-dates pregnancy 02/22/2022   Ulcerative colitis without complications ([place] 007/37/1062  She plans to breastfeed She desires  vasectomy  for postpartum contraception.   Prenatal labs and studies: ABO, Rh: --/--/PENDING (09/13 06948 Antibody: PENDING (09/13 05462 Rubella: 4.02 (02/21 1058) RPR: Non Reactive (06/21 0943)  HBsAg: Negative (02/21 1058)  HIV: Non Reactive (06/21 0943)  GVOJ:JKKXFGHW/- (08/09 1501) 1 hr Glucola  normal Genetic screening normal Anatomy [place]   Past Medical History:  Diagnosis Date   Acute mastitis of left breast    Miscarriage    Ulcerative colitis ([place]    controlled    Past Surgical History:  Procedure Laterality Date   NO PAST SURGERIES      OB History  Gravida Para Term Preterm AB Living  7 4 4   2 4   SAB IAB Ectopic Multiple Live Births  2     0 4    # Outcome Date GA Lbr Len/2nd Weight Sex Delivery Anes PTL Lv  7 Current           6 SAB 08/2020          5 SAB 2021          4 Term 05/02/16 433w0d 01:22 3970 g F Vag-Spont EPI  LIV  3 Term 11/20/12   3679 g F Vag-Spont   LIV  2 Term 02/03/09   3234 g M Vag-Spont   LIV  1 Term 05/08/07   3810 g F Vag-Spont   LIV    Social History   Socioeconomic History   Marital status: Married    Spouse name: JaJeneen RinksJRoselyn Reef  Number of children: Not on file   Years of education: Not on file   Highest education level: Not on file  Occupational History   Not on file  Tobacco Use   Smoking status:  Never    Passive exposure: Never   Smokeless tobacco: Never  Vaping Use   Vaping Use: Never used  Substance and Sexual Activity   Alcohol use: Not Currently    Comment: rare   Drug use: No   Sexual activity: Yes    Birth control/protection: None  Other Topics Concern   Not on file  Social History Narrative   Not on file   Social Determinants of Health   Financial Resource Strain: Not on file  Food Insecurity: No Food Insecurity (02/22/2022)   Hunger Vital Sign    Worried About Running Out of Food in the Last Year: Never true    Ran Out of Food in the Last Year: Never true  Transportation Needs: No Transportation Needs (02/22/2022)   PRAPARE - TrHydrologistMedical): No    Lack of Transportation (Non-Medical): No  Physical Activity: Sufficiently Active (12/04/2018)   Exercise Vital Sign    Days of Exercise per Week: 3 days    Minutes of Exercise per Session: 60 min  Stress: Not on file  Social Connections: Not on file  Family History  Problem Relation Age of Onset   Arthritis Mother    Prostate cancer Father    Heart disease Maternal Grandmother    Breast cancer Maternal Aunt    Colon cancer Neg Hx    Ovarian cancer Neg Hx    Diabetes Neg Hx     Medications Prior to Admission  Medication Sig Dispense Refill Last Dose   Prenatal Vit-Fe Fumarate-FA (PRENATAL PO) Take by mouth.   02/21/2022   aspirin EC 81 MG tablet Take 1 tablet (81 mg total) by mouth daily. Swallow whole. 90 tablet 1    Probiotic Product (PROBIOTIC-10 PO) Take by mouth.       No Known Allergies  Review of Systems: Negative except for what is mentioned in HPI.  Physical Exam: BP 112/71   Pulse 74   Temp (!) 97.2 F (36.2 C) (Oral)   Resp 16   Ht 5' 7"  (1.702 m)   Wt 71 kg   LMP 05/17/2021 (Exact Date)   BMI 24.52 kg/m  CONSTITUTIONAL: Well-developed, well-nourished female in no acute distress.  HENT:  Normocephalic, atraumatic, External right and left ear  normal. Oropharynx is clear and moist EYES: Conjunctivae and EOM are normal. Pupils are equal, round, and reactive to light. No scleral icterus.  NECK: Normal range of motion, supple, no masses SKIN: Skin is warm and dry. No rash noted. Not diaphoretic. No erythema. No pallor. NEUROLOGIC: Alert and oriented to person, place, and time. Normal reflexes, muscle tone coordination. No cranial nerve deficit noted. PSYCHIATRIC: Normal mood and affect. Normal behavior. Normal judgment and thought content. CARDIOVASCULAR: Normal heart rate noted, regular rhythm RESPIRATORY: Effort and breath sounds normal, no problems with respiration noted ABDOMEN: Soft, nontender, nondistended, gravid. MUSCULOSKELETAL: Normal range of motion. No edema and no tenderness. 2+ distal pulses.  Cervical Exam: Dilatation 4 cm   Effacement 50-60%   Station -3   Presentation: cephalic FHT:  Baseline rate 130 bpm   Variability moderate  Accelerations present   Decelerations none Contractions: Irregular, q 1-4 mins   Pertinent Labs/Studies:   Results for orders placed or performed during the hospital encounter of 02/22/22 (from the past 24 hour(s))  CBC     Status: None   Collection Time: 02/22/22  6:34 AM  Result Value Ref Range   WBC 6.6 4.0 - 10.5 K/uL   RBC 3.91 3.87 - 5.11 MIL/uL   Hemoglobin 12.6 12.0 - 15.0 g/dL   HCT 37.2 36.0 - 46.0 %   MCV 95.1 80.0 - 100.0 fL   MCH 32.2 26.0 - 34.0 pg   MCHC 33.9 30.0 - 36.0 g/dL   RDW 12.7 11.5 - 15.5 %   Platelets 154 150 - 400 K/uL   nRBC 0.0 0.0 - 0.2 %  Type and screen     Status: None (Preliminary result)   Collection Time: 02/22/22  6:34 AM  Result Value Ref Range   ABO/RH(D) PENDING    Antibody Screen PENDING    Sample Expiration      02/25/2022,2359 Performed at [facility], [address], [place], [place] [address]     Assessment : Lindsay Owens is a 41 y.o. [id number] at 74w6dbeing admitted for induction of labor due to post-dates pregnancy.  AMA status. H/o macrosomia.   Plan: Labor: Induction as ordered as per protocol with Pitocin.  AROM'd (gentle) with scant clear fluid. Analgesia as needed.  Desires epidural.  FWB: Reassuring fetal heart tracing.  GBS negative Delivery plan:  Hopeful for vaginal delivery   Rubie Maid, MD [facility]

## 2022-02-23 ENCOUNTER — Other Ambulatory Visit: Payer: Self-pay

## 2022-02-23 DIAGNOSIS — N96 Recurrent pregnancy loss: Secondary | ICD-10-CM | POA: Diagnosis present

## 2022-02-23 LAB — CBC
HCT: 35.5 % — ABNORMAL LOW (ref 36.0–46.0)
Hemoglobin: 12.4 g/dL (ref 12.0–15.0)
MCH: 32.7 pg (ref 26.0–34.0)
MCHC: 34.9 g/dL (ref 30.0–36.0)
MCV: 93.7 fL (ref 80.0–100.0)
Platelets: 153 10*3/uL (ref 150–400)
RBC: 3.79 MIL/uL — ABNORMAL LOW (ref 3.87–5.11)
RDW: 12.8 % (ref 11.5–15.5)
WBC: 9 10*3/uL (ref 4.0–10.5)
nRBC: 0 % (ref 0.0–0.2)

## 2022-02-23 MED ORDER — IBUPROFEN 600 MG PO TABS
600.0000 mg | ORAL_TABLET | Freq: Four times a day (QID) | ORAL | 0 refills | Status: DC | PRN
  Filled 2022-02-23: qty 60, 15d supply, fill #0

## 2022-02-23 NOTE — Lactation Note (Signed)
This note was copied from a baby's chart. Lactation Consultation Note  Patient Name: Lindsay Owens SMOLM'B Date: 02/23/2022 Reason for consult: Follow-up assessment;Term Age:42 hours  Lactation check-in prior to anticipated discharge.  Maternal Data Has patient been taught Hand Expression?: Yes Does the patient have breastfeeding experience prior to this delivery?: Yes  Feeding Mother's Current Feeding Choice: Breast Milk  Baby has been feeding well per mom, who is an experienced breastfeeder. Several voids/stools that exceed minimum expectations for first 24 hours.  LATCH Score   Mom had just finished a feeding, no pain other than intense uterine cramping.   Lactation Tools Discussed/Used  Employee pump given; [place].  Interventions Interventions: Breast feeding basics reviewed;Hand express;Pre-pump if needed;Reverse pressure;DEBP;Education  Reviewed normal newborn feeding patterns/behaviors, support of baby's back and head when feeding in the early days, hand expression and breast compression throughout the feeding, tips given for keeping baby awake/alert at the breast..  Discharge Discharge Education: Engorgement and breast care;Warning signs for feeding baby;Outpatient recommendation Pump: Employee Pump [facility])  Reviewed management of breast fullness/engorgement management.  Consult Status Consult Status: Complete  Outpatient lactation information provided; encouraged to call with questions/concerns.  Lindsay Owens 02/23/2022, 9:57 AM

## 2022-02-23 NOTE — Discharge Summary (Signed)
Postpartum Discharge Summary      Patient Name: Lindsay Owens DOB: 1980-07-21 MRN: [id number]  Date of admission: 02/22/2022 Delivery date:02/22/2022  Delivering provider: Rubie Maid  Date of discharge: 02/23/2022  Admitting diagnosis: Post-dates pregnancy [O48.0] Intrauterine pregnancy: [redacted]w[redacted]d    Secondary diagnosis:  Active Problems:   Advanced maternal age in multigravida, third trimester   Post-dates pregnancy   History of multiple miscarriages  Additional problems: None    Discharge diagnosis: Term Pregnancy Delivered                                              Post partum procedures: None Augmentation: AROM and Pitocin Complications: None  Hospital course: Induction of Labor With Vaginal Delivery   41y.o. yo [id number] 472w6das admitted to the hospital 02/22/2022 for induction of labor.  Indication for induction: Postdates.  Patient had an uncomplicated labor course as follows: Membrane Rupture Time/Date: 8:07 AM ,02/22/2022   Delivery Method:Vaginal, Spontaneous  Episiotomy: None  Lacerations:  1st degree;Perineal  Details of delivery can be found in separate delivery note.  Patient had a routine postpartum course. Patient is discharged home 02/23/22.  Newborn Data: Birth date:02/22/2022  Birth time:12:24 PM  Gender:Female  Living status:Living  Apgars:8 ,9  Weight:3880 g   Magnesium Sulfate received: No BMZ received: No Rhophylac:No MMR:No T-DaP:Given prenatally Flu: No Transfusion:No  Physical exam  Vitals:   02/22/22 1700 02/22/22 2200 02/23/22 0109 02/23/22 0400  BP: (!) 102/58 107/71 113/77 102/68  Pulse: 72 70 65 63  Resp: _0 Temp: 98.2 F (36.8 C) 98 F (36.7 C) 98.4 F (36.9 C) 98.5 F (36.9 C)  TempSrc:  Oral Axillary   SpO2: 97% 99% 98% 96%  Weight:      Height:       General: alert, cooperative, and no distress Lochia: appropriate Uterine Fundus: firm Incision: Healing well with no significant drainage, No  significant erythema, Dressing is clean, dry, and intact DVT Evaluation: No evidence of DVT seen on physical exam. Negative Homan's sign. No cords or calf tenderness. No significant calf/ankle edema. Labs: Lab Results  Component Value Date   WBC 9.0 02/23/2022   HGB 12.4 02/23/2022   HCT 35.5 (L) 02/23/2022   MCV 93.7 02/23/2022   PLT 153 02/23/2022      Latest Ref Rng & Units 02/16/2021    8:59 AM  CMP  Glucose 65 - 99 mg/dL 81   BUN 6 - 24 mg/dL 12   Creatinine 0.57 - 1.00 mg/dL 0.73   Sodium 134 - 144 mmol/L 138   Potassium 3.5 - 5.2 mmol/L 4.5   Chloride 96 - 106 mmol/L 101   CO2 20 - 29 mmol/L 25   Calcium 8.7 - 10.2 mg/dL 9.5   Total Protein 6.0 - 8.5 g/dL 6.8   Total Bilirubin 0.0 - 1.2 mg/dL 0.4   Alkaline Phos 44 - 121 IU/L 58   AST 0 - 40 IU/L 23   ALT 0 - 32 IU/L 23    Edinburgh Score:     No data to display            After visit meds:  Allergies as of 02/23/2022   No Known Allergies      Medication List     STOP taking these medications  aspirin EC 81 MG tablet   PROBIOTIC-10 PO       TAKE these medications    ibuprofen 600 MG tablet Commonly known as: ADVIL Take 1 tablet (600 mg total) by mouth every 6 (six) hours as needed.   PRENATAL PO Take by mouth.         Discharge home in stable condition Infant Feeding: Breast Infant Disposition:home with mother Discharge instruction: per After Visit Summary and Postpartum booklet. Activity: Advance as tolerated. Pelvic rest for 6 weeks.  Diet: routine diet Anticipated Birth Control:  Vasectomy Postpartum Appointment:6 weeks Additional Postpartum F/U: Postpartum Depression checkup at 2 weeks (televisit) Future Appointments:No future appointments. Follow up Visit:  Follow-up Information     Rubie Maid, MD Follow up.   Specialties: Obstetrics and Gynecology, Radiology Why: 2 week televisit (mood check) 6 week postpartum visit in office Contact information: [address] [place]  [id number] [phone]                     02/23/2022 Rubie Maid, MD

## 2022-02-23 NOTE — Progress Notes (Signed)
Post Partum Day # 1, s/p SVD  Subjective: no complaints, up ad lib, voiding, and tolerating PO. Breastfeeding is going well. Notes bleeding is normal.   Objective: Temp:  [97.7 F (36.5 C)-98.5 F (36.9 C)] 98.5 F (36.9 C) (09/14 0400) Pulse Rate:  [63-129] 63 (09/14 0400) Resp:  [14-18] 18 (09/14 0400) BP: (76-135)/(39-77) 102/68 (09/14 0400) SpO2:  [96 %-100 %] 96 % (09/14 0400)  Physical Exam:  General: alert and no distress  Lungs: clear to auscultation bilaterally Breasts: normal appearance, no masses or tenderness Heart: regular rate and rhythm, S1, S2 normal, no murmur, click, rub or gallop Abdomen: soft, non-tender; bowel sounds normal; no masses,  no organomegaly Pelvis: Lochia: appropriate, Uterine Fundus: firm Extremities: DVT Evaluation: No evidence of DVT seen on physical exam. Negative Homan's sign. No cords or calf tenderness. No significant calf/ankle edema.  Recent Labs    02/22/22 0634 02/23/22 0551  HGB 12.6 12.4  HCT 37.2 35.5*    Assessment/Plan: Doing well postpartum  Breastfeeding Contraception: vasectomy Discharge home today.    LOS: 1 day   Lindsay Maid, MD [facility] 02/23/2022 8:13 AM

## 2022-02-23 NOTE — Progress Notes (Signed)
Pt discharged with infant.  Discharge instructions, prescriptions and follow up appointment given to and reviewed with pt. Pt verbalized understanding. Escorted out by auxillary.

## 2022-02-23 NOTE — Anesthesia Postprocedure Evaluation (Signed)
Anesthesia Post Note  Patient: Lindsay Owens  Procedure(s) Performed: AN AD HOC LABOR EPIDURAL  Patient location during evaluation: Mother Baby Anesthesia Type: Epidural Level of consciousness: awake and alert Pain management: pain level controlled Vital Signs Assessment: post-procedure vital signs reviewed and stable Respiratory status: spontaneous breathing, nonlabored ventilation and respiratory function stable Cardiovascular status: stable Postop Assessment: no headache, no backache and epidural receding Anesthetic complications: no   No notable events documented.   Last Vitals:  Vitals:   02/23/22 0109 02/23/22 0400  BP: 113/77 102/68  Pulse: 65 63  Resp: 18 18  Temp: 36.9 C 36.9 C  SpO2: 98% 96%    Last Pain:  Vitals:   02/23/22 0400  TempSrc:   PainSc: 0-No pain                 Norvin Ohlin Lorenza Chick

## 2022-03-10 ENCOUNTER — Other Ambulatory Visit: Payer: Self-pay

## 2022-03-14 NOTE — Progress Notes (Unsigned)
   Virtual Visit via Video Note  I connected with NAME@ on 03/14/22 at   9:30 AM EDT by a video enabled telemedicine application and verified that I am speaking with the correct person using two identifiers.  Location: Patient: home Provider: office   I discussed the limitations of evaluation and management by telemedicine and the availability of in person appointments. The patient expressed understanding and agreed to proceed.    History of Present Illness:   NILSA MACHT is a 41 y.o. [phone] female who presents for a 2 week televisit for mood check. She is 2 weeks postpartum following a spontaneous vaginal  The delivery was at 40.6 gestational weeks.  Postpartum course has been well so far. Baby is feeding by breast. Bleeding: is minimal. Postpartum depression screening: negative.  EDPS score is 0.      The following portions of the patient's history were reviewed and updated as appropriate: allergies, current medications, past family history, past medical history, past social history, past surgical history, and problem list.   Observations/Objective:   Height 5' 7"  (1.702 m), weight 135 lb (61.2 kg), currently breastfeeding. Gen App: NAD Psych: normal speech, affect. Good mood.        03/15/2022    9:42 AM 02/23/2022    9:35 AM  Edinburgh Postnatal Depression Scale Screening Tool  I have been able to laugh and see the funny side of things. 0 0  I have looked forward with enjoyment to things. 0 0  I have blamed myself unnecessarily when things went wrong. 0 0  I have been anxious or worried for no good reason. 0 1  I have felt scared or panicky for no good reason. 0 0  Things have been getting on top of me. 0 0  I have been so unhappy that I have had difficulty sleeping. 0 0  I have felt sad or miserable. 0 0  I have been so unhappy that I have been crying. 0 0  The thought of harming myself has occurred to me. 0 0  Edinburgh Postnatal Depression Scale Total 0 1          Assessment and Plan:   1. Encounter for screening for maternal depression - Screening negative today. Will rescreen at 6 week postpartum visit. Overall doing well.    2. Postpartum state - Overall doing well. Continue routine postpartum home care.    3. Lactating mother - Doing well, no issues.    Follow Up Instructions:     I discussed the assessment and treatment plan with the patient. The patient was provided an opportunity to ask questions and all were answered. The patient agreed with the plan and demonstrated an understanding of the instructions.   The patient was advised to call back or seek an in-person evaluation if the symptoms worsen or if the condition fails to improve as anticipated.   I provided 5 minutes of non-face-to-face time during this encounter.     Rubie Maid, MD [facility]

## 2022-03-15 ENCOUNTER — Telehealth (INDEPENDENT_AMBULATORY_CARE_PROVIDER_SITE_OTHER): Payer: No Typology Code available for payment source | Admitting: Obstetrics and Gynecology

## 2022-03-15 ENCOUNTER — Encounter: Payer: Self-pay | Admitting: Obstetrics and Gynecology

## 2022-03-15 VITALS — Ht 67.0 in | Wt 135.0 lb

## 2022-03-15 DIAGNOSIS — Z1332 Encounter for screening for maternal depression: Secondary | ICD-10-CM | POA: Diagnosis not present

## 2022-03-23 ENCOUNTER — Encounter: Payer: Self-pay | Admitting: Obstetrics and Gynecology

## 2022-03-28 ENCOUNTER — Other Ambulatory Visit (HOSPITAL_BASED_OUTPATIENT_CLINIC_OR_DEPARTMENT_OTHER): Payer: Self-pay

## 2022-03-28 MED ORDER — INFLUENZA VAC SPLIT QUAD 0.5 ML IM SUSY
PREFILLED_SYRINGE | INTRAMUSCULAR | 0 refills | Status: DC
  Filled 2022-03-28: qty 0.5, 1d supply, fill #0

## 2022-04-11 NOTE — Progress Notes (Unsigned)
   OBST[facility] POSTPARTUM CLINIC PROGRESS NOTE  Subjective:     Lindsay Owens is a 41 y.o. [phone] female who presents for a postpartum visit. She is 6 weeks postpartum following a spontaneous vaginal delivery. I have fully reviewed the prenatal and intrapartum course. The delivery was at 40.6 gestational weeks.  Anesthesia: epidural. Postpartum course has been well. Baby's course has been well. Baby is feeding by breast. Bleeding: patient has not not resumed menses, with No LMP recorded.. Bowel function is normal. Bladder function is normal. Patient is not sexually active. Contraception method desired is vasectomy. Postpartum depression screening: negative.  EDPS score is negative (score is 2).    The following portions of the patient's history were reviewed and updated as appropriate: allergies, current medications, past family history, past medical history, past social history, past surgical history, and problem list.  Review of Systems Pertinent items noted in HPI and remainder of comprehensive ROS otherwise negative.   Objective:    There were no vitals taken for this visit.  General:  alert and no distress   Breasts:  inspection negative, no nipple discharge or bleeding, no masses or nodularity palpable  Lungs: clear to auscultation bilaterally  Heart:  regular rate and rhythm, S1, S2 normal, no murmur, click, rub or gallop  Abdomen: soft, non-tender; bowel sounds normal; no masses,  no organomegaly.    Vulva:  normal  Vagina: normal vagina, no discharge, exudate, lesion, or erythema  Cervix:  no cervical motion tenderness and no lesions  Corpus: normal size, contour, position, consistency, mobility, non-tender  Adnexa:  normal adnexa and no mass, fullness, tenderness  Rectal Exam: Not performed.             04/12/2022   10:26 AM 03/15/2022    9:42 AM 02/23/2022    9:35 AM  Edinburgh Postnatal Depression Scale Screening Tool  I have been able to laugh and see the funny side of  things. 0 0 0  I have looked forward with enjoyment to things. 0 0 0  I have blamed myself unnecessarily when things went wrong. 1 0 0  I have been anxious or worried for no good reason. 1 0 1  I have felt scared or panicky for no good reason. 0 0 0  Things have been getting on top of me. 0 0 0  I have been so unhappy that I have had difficulty sleeping. 0 0 0  I have felt sad or miserable. 0 0 0  I have been so unhappy that I have been crying. 0 0 0  The thought of harming myself has occurred to me. 0 0 0  Edinburgh Postnatal Depression Scale Total 2 0 1     Labs:  Lab Results  Component Value Date   HGB 12.4 02/23/2022     Assessment:   1. Postpartum care following vaginal delivery   2. Lactating mother      Plan:   1. Contraception: vasectomy 2. Follow up in: 6 months for annual exam, or sooner as needed.    Rubie Maid, MD [place]

## 2022-04-11 NOTE — Patient Instructions (Incomplete)

## 2022-04-12 ENCOUNTER — Encounter: Payer: Self-pay | Admitting: Obstetrics and Gynecology

## 2022-04-12 ENCOUNTER — Ambulatory Visit (INDEPENDENT_AMBULATORY_CARE_PROVIDER_SITE_OTHER): Payer: No Typology Code available for payment source | Admitting: Obstetrics and Gynecology

## 2022-08-13 ENCOUNTER — Other Ambulatory Visit: Payer: Self-pay

## 2022-08-13 ENCOUNTER — Other Ambulatory Visit: Payer: Self-pay | Admitting: Family

## 2022-08-13 MED ORDER — AMOXICILLIN-POT CLAVULANATE 875-125 MG PO TABS
1.0000 | ORAL_TABLET | Freq: Two times a day (BID) | ORAL | 0 refills | Status: DC
  Filled 2022-08-13: qty 14, 7d supply, fill #0

## 2022-08-14 ENCOUNTER — Other Ambulatory Visit: Payer: Self-pay

## 2023-01-02 ENCOUNTER — Ambulatory Visit: Payer: 59 | Admitting: Obstetrics and Gynecology

## 2023-01-05 NOTE — Patient Instructions (Incomplete)
Preventive Care 40-43 Years Old, Female Preventive care refers to lifestyle choices and visits with your health care provider that can promote health and wellness. Preventive care visits are also called wellness exams. What can I expect for my preventive care visit? Counseling Your health care provider may ask you questions about your: Medical history, including: Past medical problems. Family medical history. Pregnancy history. Current health, including: Menstrual cycle. Method of birth control. Emotional well-being. Home life and relationship well-being. Sexual activity and sexual health. Lifestyle, including: Alcohol, nicotine or tobacco, and drug use. Access to firearms. Diet, exercise, and sleep habits. Work and work environment. Sunscreen use. Safety issues such as seatbelt and bike helmet use. Physical exam Your health care provider will check your: Height and weight. These may be used to calculate your BMI (body mass index). BMI is a measurement that tells if you are at a healthy weight. Waist circumference. This measures the distance around your waistline. This measurement also tells if you are at a healthy weight and may help predict your risk of certain diseases, such as type 2 diabetes and high blood pressure. Heart rate and blood pressure. Body temperature. Skin for abnormal spots. What immunizations do I need?  Vaccines are usually given at various ages, according to a schedule. Your health care provider will recommend vaccines for you based on your age, medical history, and lifestyle or other factors, such as travel or where you work. What tests do I need? Screening Your health care provider may recommend screening tests for certain conditions. This may include: Lipid and cholesterol levels. Diabetes screening. This is done by checking your blood sugar (glucose) after you have not eaten for a while (fasting). Pelvic exam and Pap test. Hepatitis B test. Hepatitis C  test. HIV (human immunodeficiency virus) test. STI (sexually transmitted infection) testing, if you are at risk. Lung cancer screening. Colorectal cancer screening. Mammogram. Talk with your health care provider about when you should start having regular mammograms. This may depend on whether you have a family history of breast cancer. BRCA-related cancer screening. This may be done if you have a family history of breast, ovarian, tubal, or peritoneal cancers. Bone density scan. This is done to screen for osteoporosis. Talk with your health care provider about your test results, treatment options, and if necessary, the need for more tests. Follow these instructions at home: Eating and drinking  Eat a diet that includes fresh fruits and vegetables, whole grains, lean protein, and low-fat dairy products. Take vitamin and mineral supplements as recommended by your health care provider. Do not drink alcohol if: Your health care provider tells you not to drink. You are pregnant, may be pregnant, or are planning to become pregnant. If you drink alcohol: Limit how much you have to 0-1 drink a day. Know how much alcohol is in your drink. In the [place] one drink equals one 12 oz bottle of beer (355 mL), one 5 oz glass of wine (148 mL), or one 1 oz glass of hard liquor (44 mL). Lifestyle Brush your teeth every morning and night with fluoride toothpaste. Floss one time each day. Exercise for at least 30 minutes 5 or more days each week. Do not use any products that contain nicotine or tobacco. These products include cigarettes, chewing tobacco, and vaping devices, such as e-cigarettes. If you need help quitting, ask your health care provider. Do not use drugs. If you are sexually active, practice safe sex. Use a condom or other form of protection to   prevent STIs. If you do not wish to become pregnant, use a form of birth control. If you plan to become pregnant, see your health care provider for a  prepregnancy visit. Take aspirin only as told by your health care provider. Make sure that you understand how much to take and what form to take. Work with your health care provider to find out whether it is safe and beneficial for you to take aspirin daily. Find healthy ways to manage stress, such as: Meditation, yoga, or listening to music. Journaling. Talking to a trusted person. Spending time with friends and family. Minimize exposure to UV radiation to reduce your risk of skin cancer. Safety Always wear your seat belt while driving or riding in a vehicle. Do not drive: If you have been drinking alcohol. Do not ride with someone who has been drinking. When you are tired or distracted. While texting. If you have been using any mind-altering substances or drugs. Wear a helmet and other protective equipment during sports activities. If you have firearms in your house, make sure you follow all gun safety procedures. Seek help if you have been physically or sexually abused. What's next? Visit your health care provider once a year for an annual wellness visit. Ask your health care provider how often you should have your eyes and teeth checked. Stay up to date on all vaccines. This information is not intended to replace advice given to you by your health care provider. Make sure you discuss any questions you have with your health care provider. Document Revised: 11/24/2020 Document Reviewed: 11/24/2020 Elsevier Patient Education  2024 [employer] Breast Self-Awareness Breast self-awareness is knowing how your breasts look and feel. You need to: Check your breasts on a regular basis. Tell your doctor about any changes. Become familiar with the look and feel of your breasts. This can help you catch a breast problem while it is still small and can be treated. You should do breast self-exams even if you have breast implants. What you need: A mirror. A well-lit room. A pillow or other  soft object. How to do a breast self-exam Follow these steps to do a breast self-exam: Look for changes  Take off all the clothes above your waist. Stand in front of a mirror in a room with good lighting. Put your hands down at your sides. Compare your breasts in the mirror. Look for any difference between them, such as: A difference in shape. A difference in size. Wrinkles, dips, and bumps in one breast and not the other. Look at each breast for changes in the skin, such as: Redness. Scaly areas. Skin that has gotten thicker. Dimpling. Open sores (ulcers). Look for changes in your nipples, such as: Fluid coming out of a nipple. Fluid around a nipple. Bleeding. Dimpling. Redness. A nipple that looks pushed in (retracted), or that has changed position. Feel for changes Lie on your back. Feel each breast. To do this: Pick a breast to feel. Place a pillow under the shoulder closest to that breast. Put the arm closest to that breast behind your head. Feel the nipple area of that breast using the hand of your other arm. Feel the area with the pads of your three middle fingers by making small circles with your fingers. Use light, medium, and firm pressure. Continue the overlapping circles, moving downward over the breast. Keep making circles with your fingers. Stop when you feel your ribs. Start making circles with your fingers again, this time going   upward until you reach your collarbone. Then, make circles outward across your breast and into your armpit area. Squeeze your nipple. Check for discharge and lumps. Repeat these steps to check your other breast. Sit or stand in the tub or shower. With soapy water on your skin, feel each breast the same way you did when you were lying down. Write down what you find Writing down what you find can help you remember what to tell your doctor. Write down: What is normal for each breast. Any changes you find in each breast. These  include: The kind of changes you find. A tender or painful breast. Any lump you find. Write down its size and where it is. When you last had your monthly period (menstrual cycle). General tips If you are breastfeeding, the best time to check your breasts is after you feed your baby or after you use a breast pump. If you get monthly bleeding, the best time to check your breasts is 5-7 days after your monthly cycle ends. With time, you will become comfortable with the self-exam. You will also start to know if there are changes in your breasts. Contact a doctor if: You see a change in the shape or size of your breasts or nipples. You see a change in the skin of your breast or nipples, such as red or scaly skin. You have fluid coming from your nipples that is not normal. You find a new lump or thick area. You have breast pain. You have any concerns about your breast health. Summary Breast self-awareness includes looking for changes in your breasts and feeling for changes within your breasts. You should do breast self-awareness in front of a mirror in a well-lit room. If you get monthly periods (menstrual cycles), the best time to check your breasts is 5-7 days after your period ends. Tell your doctor about any changes you see in your breasts. Changes include changes in size, changes on the skin, painful or tender breasts, or fluid from your nipples that is not normal. This information is not intended to replace advice given to you by your health care provider. Make sure you discuss any questions you have with your health care provider. Document Revised: 11/03/2021 Document Reviewed: 03/31/2021 Elsevier Patient Education  2024 [employer]  

## 2023-01-05 NOTE — Progress Notes (Signed)
GYNECOLOGY ANNUAL PHYSICAL EXAM PROGRESS NOTE  Subjective:    Lindsay Owens is a 42 y.o. [phone] female who presents for an annual exam. The patient has no complaints today. The patient is sexually active. The patient participates in regular exercise: yes. Has the patient ever been transfused or tattooed?: no. The patient reports that there is not domestic violence in her life.    Menstrual History: Menarche age: 14 or 66 Patient's last menstrual period was 01/09/2023 (exact date). Period Cycle (Days): 27 Period Duration (Days): 4-5 Period Pattern: Regular Menstrual Flow: Moderate Menstrual Control: Maxi pad Menstrual Control Change Freq (Hours): 2 Dysmenorrhea: None     Gynecologic History:  Contraception: vasectomy History of STI's: Denies Last Pap: 02/12/2020. Results were: normal.  Reports remote h/o abnormal pap smear x 1 followed by colposcopy.  All since then have been normal..  Last mammogram: 11/02/2020. Results were: normal    Upstream - 01/09/23 0903       Pregnancy Intention Screening   Does the patient want to become pregnant in the next year? No    Does the patient's partner want to become pregnant in the next year? No    Would the patient like to discuss contraceptive options today? No      Contraception Wrap Up   Current Method Vasectomy    End Method Vasectomy    Contraception Counseling Provided No    How was the end contraceptive method provided? N/A            The pregnancy intention screening data noted above was reviewed. Potential methods of contraception were discussed. The patient elected to proceed with Vasectomy.    OB History  Gravida Para Term Preterm AB Living  7 5 5  0 2 5  SAB IAB Ectopic Multiple Live Births  2 0 0 0 5    # Outcome Date GA Lbr Len/2nd Weight Sex Type Anes PTL Lv  7 Term 02/22/22 [redacted]w[redacted]d / 00:20 8 lb 8.9 oz (3.88 kg) F Vag-Spont EPI  LIV     Name: KMIYAH, GOLUB     Apgar1: 8  Apgar5: 9  6 SAB 08/2020           5 SAB 2021          4 Term 05/02/16 [redacted]w[redacted]d / 01:22 8 lb 12 oz (3.97 kg) F Vag-Spont EPI  LIV     Name: Cypert,PENDINGBABY     Apgar1: 8  Apgar5: 9  3 Term 11/20/12   8 lb 1.8 oz (3.679 kg) F Vag-Spont   LIV  2 Term 02/03/09   7 lb 2.1 oz (3.234 kg) M Vag-Spont   LIV  1 Term 05/08/07   8 lb 6.4 oz (3.81 kg) F Vag-Spont   LIV    Past Medical History:  Diagnosis Date   Acute mastitis of left breast    Miscarriage    Ulcerative colitis (HCC)    controlled    Past Surgical History:  Procedure Laterality Date   NO PAST SURGERIES      Family History  Problem Relation Age of Onset   Arthritis Mother    Prostate cancer Father    Heart disease Maternal Grandmother    Breast cancer Maternal Aunt    Colon cancer Neg Hx    Ovarian cancer Neg Hx    Diabetes Neg Hx     Social History   Socioeconomic History   Marital status: Married    Spouse name: Fayrene Fearing Riesterer)  Number of children: Not on file   Years of education: Not on file   Highest education level: Not on file  Occupational History   Not on file  Tobacco Use   Smoking status: Never    Passive exposure: Never   Smokeless tobacco: Never  Vaping Use   Vaping status: Never Used  Substance and Sexual Activity   Alcohol use: Not Currently    Comment: rare   Drug use: No   Sexual activity: Yes    Birth control/protection: None  Other Topics Concern   Not on file  Social History Narrative   Not on file   Social Determinants of Health   Financial Resource Strain: Not on file  Food Insecurity: No Food Insecurity (02/22/2022)   Hunger Vital Sign    Worried About Running Out of Food in the Last Year: Never true    Ran Out of Food in the Last Year: Never true  Transportation Needs: No Transportation Needs (02/22/2022)   PRAPARE - Administrator, Civil Service (Medical): No    Lack of Transportation (Non-Medical): No  Physical Activity: Sufficiently Active (12/04/2018)   Exercise Vital Sign    Days of  Exercise per Week: 3 days    Minutes of Exercise per Session: 60 min  Stress: Not on file  Social Connections: Not on file  Intimate Partner Violence: Not At Risk (02/22/2022)   Humiliation, Afraid, Rape, and Kick questionnaire    Fear of Current or Ex-Partner: No    Emotionally Abused: No    Physically Abused: No    Sexually Abused: No    Current Outpatient Medications on File Prior to Visit  Medication Sig Dispense Refill   amoxicillin-clavulanate (AUGMENTIN) 875-125 MG tablet Take 1 tablet by mouth 2 (two) times daily. 14 tablet 0   ibuprofen (ADVIL) 600 MG tablet Take 1 tablet (600 mg total) by mouth every 6 (six) hours as needed. (Patient not taking: Reported on 04/12/2022) 60 tablet 0   influenza vac split quadrivalent PF (FLUARIX) 0.5 ML injection Inject into the muscle. (Patient not taking: Reported on 04/12/2022) 0.5 mL 0   Prenatal Vit-Fe Fumarate-FA (PRENATAL PO) Take by mouth.     No current facility-administered medications on file prior to visit.    No Known Allergies   Review of Systems Constitutional: negative for chills, fatigue, fevers and sweats Eyes: negative for irritation, redness and visual disturbance Ears, nose, mouth, throat, and face: negative for hearing loss, nasal congestion, snoring and tinnitus Respiratory: negative for asthma, cough, sputum Cardiovascular: negative for chest pain, dyspnea, exertional chest pressure/discomfort, irregular heart beat, palpitations and syncope Gastrointestinal: negative for abdominal pain, change in bowel habits, nausea and vomiting Genitourinary: negative for abnormal menstrual periods, genital lesions, sexual problems and vaginal discharge, dysuria and urinary incontinence Integument/breast: negative for breast lump, breast tenderness and nipple discharge Hematologic/lymphatic: negative for bleeding and easy bruising Musculoskeletal:negative for back pain and muscle weakness Neurological: negative for dizziness,  headaches, vertigo and weakness Endocrine: negative for diabetic symptoms including polydipsia, polyuria and skin dryness Allergic/Immunologic: negative for hay fever and urticaria      Objective:  Blood pressure 98/77, pulse 77, resp. rate 16, height 5\' 7"  (1.702 m), weight 125 lb 3.2 oz (56.8 kg), last menstrual period 01/09/2023, currently breastfeeding. Body mass index is 19.61 kg/m.   General Appearance:    Alert, cooperative, no distress, appears stated age  Head:    Normocephalic, without obvious abnormality, atraumatic  Eyes:    PERRL,  conjunctiva/corneas clear, EOM's intact, both eyes  Ears:    Normal external ear canals, both ears  Nose:   Nares normal, septum midline, mucosa normal, no drainage or sinus tenderness  Throat:   Lips, mucosa, and tongue normal; teeth and gums normal  Neck:   Supple, symmetrical, trachea midline, no adenopathy; thyroid: no enlargement/tenderness/nodules; no carotid bruit or JVD  Back:     Symmetric, no curvature, ROM normal, no CVA tenderness  Lungs:     Clear to auscultation bilaterally, respirations unlabored  Chest Wall:    No tenderness or deformity   Heart:    Regular rate and rhythm, S1 and S2 normal, no murmur, rub or gallop  Breast Exam:    No tenderness, masses, or nipple abnormality. Clogged milk duct noted on right breast at 1 o'clock.   Abdomen:     Soft, non-tender, bowel sounds active all four quadrants, no masses, no organomegaly.    Genitalia:    Pelvic:external genitalia normal, vagina without lesions, scant dark red blood in vault. Rectovaginal septum  normal. Cervix normal in appearance, no cervical motion tenderness, no adnexal masses or tenderness.  Uterus normal size, shape, mobile, regular contours, nontender.  Rectal:    Normal external sphincter.  No hemorrhoids appreciated. Internal exam not done.   Extremities:   Extremities normal, atraumatic, no cyanosis or edema  Pulses:   2+ and symmetric all extremities  Skin:   Skin  color, texture, turgor normal, no rashes or lesions  Lymph nodes:   Cervical, supraclavicular, and axillary nodes normal  Neurologic:   CNII-XII intact, normal strength, sensation and reflexes throughout   .  Labs:  Lab Results  Component Value Date   WBC 9.0 02/23/2022   HGB 12.4 02/23/2022   HCT 35.5 (L) 02/23/2022   MCV 93.7 02/23/2022   PLT 153 02/23/2022    Lab Results  Component Value Date   CREATININE 0.73 02/16/2021   BUN 12 02/16/2021   NA 138 02/16/2021   K 4.5 02/16/2021   CL 101 02/16/2021   CO2 25 02/16/2021    Lab Results  Component Value Date   ALT 23 02/16/2021   AST 23 02/16/2021   ALKPHOS 58 02/16/2021   BILITOT 0.4 02/16/2021    Lab Results  Component Value Date   TSH 1.010 02/16/2021     Assessment:   1. Encounter for well woman exam with routine gynecological exam   2. Screening for diabetes mellitus (DM)   3. Screening cholesterol level   4. Cervical cancer screening   5. Encounter for screening mammogram for malignant neoplasm of breast   6. Lactating mother      Plan:  Blood tests: Ordered today. Breast self exam technique reviewed and patient encouraged to perform self-exam monthly. Contraception: vasectomy. Discussed healthy lifestyle modifications. Mammogram ordered, still breastfeeding so can postpone until later in the year once desired.  Pap smear ordered. Follow up in 1 year for annual exam   Hildred Laser, MD [facility]

## 2023-01-09 ENCOUNTER — Encounter: Payer: Self-pay | Admitting: Obstetrics and Gynecology

## 2023-01-09 ENCOUNTER — Other Ambulatory Visit (HOSPITAL_COMMUNITY)
Admission: RE | Admit: 2023-01-09 | Discharge: 2023-01-09 | Disposition: A | Discharge status: Home / Self Care | Payer: 59 | Source: Ambulatory Visit | Attending: Obstetrics and Gynecology | Admitting: Obstetrics and Gynecology

## 2023-01-09 ENCOUNTER — Ambulatory Visit (INDEPENDENT_AMBULATORY_CARE_PROVIDER_SITE_OTHER): Payer: 59 | Admitting: Obstetrics and Gynecology

## 2023-01-09 VITALS — BP 98/77 | HR 77 | Resp 16 | Ht 67.0 in | Wt 125.2 lb

## 2023-01-09 DIAGNOSIS — Z13220 Encounter for screening for lipoid disorders: Secondary | ICD-10-CM | POA: Diagnosis not present

## 2023-01-09 DIAGNOSIS — Z1231 Encounter for screening mammogram for malignant neoplasm of breast: Secondary | ICD-10-CM

## 2023-01-09 DIAGNOSIS — Z131 Encounter for screening for diabetes mellitus: Secondary | ICD-10-CM

## 2023-01-09 DIAGNOSIS — Z124 Encounter for screening for malignant neoplasm of cervix: Secondary | ICD-10-CM

## 2023-01-09 DIAGNOSIS — Z01419 Encounter for gynecological examination (general) (routine) without abnormal findings: Secondary | ICD-10-CM | POA: Diagnosis not present

## 2023-01-09 NOTE — Addendum Note (Signed)
Addended by: Tommie Raymond on: 01/09/2023 09:30 AM   Modules accepted: Orders

## 2023-04-12 ENCOUNTER — Ambulatory Visit
Admission: RE | Admit: 2023-04-12 | Discharge: 2023-04-12 | Disposition: A | Discharge status: Home / Self Care | Payer: 59 | Source: Ambulatory Visit | Attending: Obstetrics and Gynecology | Admitting: Obstetrics and Gynecology

## 2023-04-12 ENCOUNTER — Encounter: Payer: Self-pay | Admitting: Radiology

## 2023-04-12 DIAGNOSIS — Z1231 Encounter for screening mammogram for malignant neoplasm of breast: Secondary | ICD-10-CM | POA: Diagnosis not present

## 2023-04-12 DIAGNOSIS — Z01419 Encounter for gynecological examination (general) (routine) without abnormal findings: Secondary | ICD-10-CM | POA: Diagnosis not present

## 2023-12-06 ENCOUNTER — Telehealth: Payer: Self-pay

## 2023-12-06 NOTE — Telephone Encounter (Signed)
 The patient called and left a voicemail requesting a colonoscopy and an appointment with Dr. Jinny. I returned her call but she did not answer, so I left a voicemail informing her that we received her message. 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  In the voicemail, I advised her that she can contact [facility] if she prefers to stay in the [place] area. Alternatively, she may choose [facility] or [facility] to remain within the [facility], as she is an Human resources officer. I also informed her that Raynaldo is currently scheduling appointments within the next week or two.

## 2024-01-21 NOTE — Patient Instructions (Signed)
 Preventive Care 58-44 Years Old, Female  Preventive care refers to lifestyle choices and visits with your health care provider that can promote health and wellness. Preventive care visits are also called wellness exams.  What can I expect for my preventive care visit?  Counseling  Your health care provider may ask you questions about your:  Medical history, including:  Past medical problems.  Family medical history.  Pregnancy history.  Current health, including:  Menstrual cycle.  Method of birth control.  Emotional well-being.  Home life and relationship well-being.  Sexual activity and sexual health.  Lifestyle, including:  Alcohol, nicotine or tobacco, and drug use.  Access to firearms.  Diet, exercise, and sleep habits.  Work and work Astronomer.  Sunscreen use.  Safety issues such as seatbelt and bike helmet use.  Physical exam  Your health care provider will check your:  Height and weight. These may be used to calculate your BMI (body mass index). BMI is a measurement that tells if you are at a healthy weight.  Waist circumference. This measures the distance around your waistline. This measurement also tells if you are at a healthy weight and may help predict your risk of certain diseases, such as type 2 diabetes and high blood pressure.  Heart rate and blood pressure.  Body temperature.  Skin for abnormal spots.  What immunizations do I need?    Vaccines are usually given at various ages, according to a schedule. Your health care provider will recommend vaccines for you based on your age, medical history, and lifestyle or other factors, such as travel or where you work.  What tests do I need?  Screening  Your health care provider may recommend screening tests for certain conditions. This may include:  Lipid and cholesterol levels.  Diabetes screening. This is done by checking your blood sugar (glucose) after you have not eaten for a while (fasting).  Pelvic exam and Pap test.  Hepatitis B test.  Hepatitis C  test.  HIV (human immunodeficiency virus) test.  STI (sexually transmitted infection) testing, if you are at risk.  Lung cancer screening.  Colorectal cancer screening.  Mammogram. Talk with your health care provider about when you should start having regular mammograms. This may depend on whether you have a family history of breast cancer.  BRCA-related cancer screening. This may be done if you have a family history of breast, ovarian, tubal, or peritoneal cancers.  Bone density scan. This is done to screen for osteoporosis.  Talk with your health care provider about your test results, treatment options, and if necessary, the need for more tests.  Follow these instructions at home:  Eating and drinking    Eat a diet that includes fresh fruits and vegetables, whole grains, lean protein, and low-fat dairy products.  Take vitamin and mineral supplements as recommended by your health care provider.  Do not drink alcohol if:  Your health care provider tells you not to drink.  You are pregnant, may be pregnant, or are planning to become pregnant.  If you drink alcohol:  Limit how much you have to 0-1 drink a day.  Know how much alcohol is in your drink. In the [place] one drink equals one 12 oz bottle of beer (355 mL), one 5 oz glass of wine (148 mL), or one 1 oz glass of hard liquor (44 mL).  Lifestyle  Brush your teeth every morning and night with fluoride toothpaste. Floss one time each day.  Exercise for at least  30 minutes 5 or more days each week.  Do not use any products that contain nicotine or tobacco. These products include cigarettes, chewing tobacco, and vaping devices, such as e-cigarettes. If you need help quitting, ask your health care provider.  Do not use drugs.  If you are sexually active, practice safe sex. Use a condom or other form of protection to prevent STIs.  If you do not wish to become pregnant, use a form of birth control. If you plan to become pregnant, see your health care provider for a  prepregnancy visit.  Take aspirin only as told by your health care provider. Make sure that you understand how much to take and what form to take. Work with your health care provider to find out whether it is safe and beneficial for you to take aspirin daily.  Find healthy ways to manage stress, such as:  Meditation, yoga, or listening to music.  Journaling.  Talking to a trusted person.  Spending time with friends and family.  Minimize exposure to UV radiation to reduce your risk of skin cancer.  Safety  Always wear your seat belt while driving or riding in a vehicle.  Do not drive:  If you have been drinking alcohol. Do not ride with someone who has been drinking.  When you are tired or distracted.  While texting.  If you have been using any mind-altering substances or drugs.  Wear a helmet and other protective equipment during sports activities.  If you have firearms in your house, make sure you follow all gun safety procedures.  Seek help if you have been physically or sexually abused.  What's next?  Visit your health care provider once a year for an annual wellness visit.  Ask your health care provider how often you should have your eyes and teeth checked.  Stay up to date on all vaccines.  This information is not intended to replace advice given to you by your health care provider. Make sure you discuss any questions you have with your health care provider.  Document Revised: 11/24/2020 Document Reviewed: 11/24/2020  Elsevier Patient Education  2024 [employer].

## 2024-01-21 NOTE — Progress Notes (Unsigned)
 GYNECOLOGY ANNUAL PHYSICAL EXAM NOTE  Subjective:    Lindsay Owens is a 43 y.o. [phone] female who presents for an annual exam.  The patient {is/is not/has never been:13135} sexually active. The patient participates in regular exercise: {yes/no/not asked:9010}. Has the patient ever been transfused or tattooed?: {yes/no/not asked:9010}. The patient reports that there {is/is not:9024} domestic violence in her life. The patient has completed the Gardasil vaccine: {yes/no:311178}  The patient has the following complaints today: ***  Menstrual History: Menarche age: 62 No LMP recorded.     Gynecologic History:  Contraception: vasectomy History of STI's:  Last Pap: 01/09/23. Results were: normal.   History of abnormal pap(s): no Last mammogram: 04/12/23. Results were: normal       OB History  Gravida Para Term Preterm AB Living  7 5 5  0 2 5  SAB IAB Ectopic Multiple Live Births  2 0 0 0 5    # Outcome Date GA Lbr Len/2nd Weight Sex Type Anes PTL Lv  7 Term 02/22/22 [redacted]w[redacted]d / 00:20 8 lb 8.9 oz (3.88 kg) F Vag-Spont EPI  LIV     Name: COURTNEY, BELLIZZI     Apgar1: 8  Apgar5: 9  6 SAB 08/2020          5 SAB 2021          4 Term 05/02/16 [redacted]w[redacted]d / 01:22 8 lb 12 oz (3.97 kg) F Vag-Spont EPI  LIV     Name: Slappey,PENDINGBABY     Apgar1: 8  Apgar5: 9  3 Term 11/20/12   8 lb 1.8 oz (3.679 kg) F Vag-Spont   LIV  2 Term 02/03/09   7 lb 2.1 oz (3.234 kg) M Vag-Spont   LIV  1 Term 05/08/07   8 lb 6.4 oz (3.81 kg) F Vag-Spont   LIV    Past Medical History:  Diagnosis Date   Acute mastitis of left breast    Miscarriage    Ulcerative colitis (HCC)    controlled    Past Surgical History:  Procedure Laterality Date   NO PAST SURGERIES      Family History  Problem Relation Age of Onset   Arthritis Mother    Prostate cancer Father    Heart disease Maternal Grandmother    Breast cancer Maternal Aunt    Colon cancer Neg Hx    Ovarian cancer Neg Hx    Diabetes Neg Hx      Social History   Socioeconomic History   Marital status: Married    Spouse name: Lynwood Karle)   Number of children: Not on file   Years of education: Not on file   Highest education level: Not on file  Occupational History   Not on file  Tobacco Use   Smoking status: Never    Passive exposure: Never   Smokeless tobacco: Never  Vaping Use   Vaping status: Never Used  Substance and Sexual Activity   Alcohol use: Not Currently    Comment: rare   Drug use: No   Sexual activity: Yes    Birth control/protection: None  Other Topics Concern   Not on file  Social History Narrative   Not on file   Social Drivers of Health   Financial Resource Strain: Not on file  Food Insecurity: No Food Insecurity (02/22/2022)   Hunger Vital Sign    Worried About Running Out of Food in the Last Year: Never true    Ran Out of Food in  the Last Year: Never true  Transportation Needs: No Transportation Needs (02/22/2022)   PRAPARE - Administrator, Civil Service (Medical): No    Lack of Transportation (Non-Medical): No  Physical Activity: Sufficiently Active (12/04/2018)   Exercise Vital Sign    Days of Exercise per Week: 3 days    Minutes of Exercise per Session: 60 min  Stress: Not on file  Social Connections: Not on file  Intimate Partner Violence: Not At Risk (02/22/2022)   Humiliation, Afraid, Rape, and Kick questionnaire    Fear of Current or Ex-Partner: No    Emotionally Abused: No    Physically Abused: No    Sexually Abused: No    No current outpatient medications on file prior to visit.   No current facility-administered medications on file prior to visit.    No Known Allergies   Review of Systems Constitutional: negative for chills, fatigue, fevers and sweats Eyes: negative for irritation, redness and visual disturbance Ears, nose, mouth, throat, and face: negative for hearing loss, nasal congestion, snoring and tinnitus Respiratory: negative for asthma,  cough, sputum Cardiovascular: negative for chest pain, dyspnea, exertional chest pressure/discomfort, irregular heart beat, palpitations and syncope Gastrointestinal: negative for abdominal pain, change in bowel habits, nausea and vomiting Genitourinary: negative for abnormal menstrual periods, genital lesions, sexual problems and vaginal discharge, dysuria and urinary incontinence Integument/breast: negative for breast lump, breast tenderness and nipple discharge Hematologic/lymphatic: negative for bleeding and easy bruising Musculoskeletal:negative for back pain and muscle weakness Neurological: negative for dizziness, headaches, vertigo and weakness Endocrine: negative for diabetic symptoms including polydipsia, polyuria and skin dryness Allergic/Immunologic: negative for hay fever and urticaria      Objective:  currently breastfeeding. There is no height or weight on file to calculate BMI.    General Appearance:    Alert, cooperative, no distress, appears stated age  Head:    Normocephalic, without obvious abnormality, atraumatic  Eyes:    Conjunctiva/corneas clear, EOMs intact bilaterally  Ears:    Normal external ear canals bilaterally  Nose:   Nares normal  Throat:   Lips, mucosa, and tongue normal; teeth and gums normal  Neck:   Supple, symmetrical, trachea midline, no adenopathy; thyroid : no enlargement/tenderness/nodules; no carotid bruit or JVD  Back:     ROM normal, no CVA tenderness  Lungs:     Clear to auscultation bilaterally, respirations unlabored  Chest Wall:    No tenderness or deformity   Heart:    Regular rate and rhythm, S1 and S2 normal, no appreciated murmur, rub or gallop  Breast Exam:    No tenderness, masses, or nipple abnormality; no skin retraction, dimpling or nipple discharge.  Abdomen:     Soft, non-tender, bowel sounds active all four quadrants, no masses, no organomegaly.    Genitalia:    Pelvic: external genitalia normal, vagina without lesions,  discharge, or tenderness, rectovaginal septum  normal. Cervix normal in appearance, no cervical motion tenderness, no adnexal masses or tenderness.  Uterus normal size, shape, mobile, regular contours, nontender.  Rectal:    Normal external sphincter.  No hemorrhoids appreciated. Internal exam not done.   Extremities:   Extremities normal, atraumatic, no cyanosis or edema  Pulses:   2+ and symmetric all extremities  Skin:   Skin color, texture, turgor normal, no rashes or lesions  Lymph nodes:   Cervical, supraclavicular, and axillary nodes normal  Neurologic:   CNII-XII grossly intact, normal strength, sensation and reflexes throughout   .  Labs:  Lab Results  Component Value Date   WBC 5.5 01/09/2023   HGB 14.0 01/09/2023   HCT 43.9 01/09/2023   MCV 96 01/09/2023   PLT 249 01/09/2023    Lab Results  Component Value Date   CREATININE 0.78 01/09/2023   BUN 13 01/09/2023   NA 141 01/09/2023   K 4.2 01/09/2023   CL 102 01/09/2023   CO2 25 01/09/2023    Lab Results  Component Value Date   ALT 17 01/09/2023   AST 17 01/09/2023   ALKPHOS 76 01/09/2023   BILITOT 0.5 01/09/2023    Lab Results  Component Value Date   TSH 1.010 02/16/2021     Assessment:   No diagnosis found.   Plan:   Lindsay Owens is a 43 y.o. [phone] female here today for her annual exam, doing well.  Pap: due 01/09/28 Mammogram: ordered due 03/2024 Labs: ordered today A1C, CMP, HepC, Lipid panel, Vit D, TSH PHQ-2 = ***, discussed coping techniques; RTC if worsens or develops concern Contraception: vasectomy Vaccines: Gardasil complete Healthy lifestyle modifications discussed: multivitamin, diet, exercise, sunscreen. Emphasized importance of regular physical activity.  Folic Acid recommendation reviewed.  All questions answered to patient's satisfaction.   Follow up 1 yr for annual, sooner prn.    Estil Mangle, DO [facility]

## 2024-01-30 ENCOUNTER — Ambulatory Visit (INDEPENDENT_AMBULATORY_CARE_PROVIDER_SITE_OTHER): Payer: Self-pay | Admitting: Obstetrics

## 2024-01-30 ENCOUNTER — Encounter: Payer: Self-pay | Admitting: Obstetrics

## 2024-01-30 VITALS — BP 106/70 | HR 69 | Ht 67.0 in | Wt 122.0 lb

## 2024-01-30 DIAGNOSIS — Z1331 Encounter for screening for depression: Secondary | ICD-10-CM | POA: Diagnosis not present

## 2024-01-30 DIAGNOSIS — Z01419 Encounter for gynecological examination (general) (routine) without abnormal findings: Secondary | ICD-10-CM

## 2024-01-30 DIAGNOSIS — Z1231 Encounter for screening mammogram for malignant neoplasm of breast: Secondary | ICD-10-CM

## 2024-01-30 DIAGNOSIS — L439 Lichen planus, unspecified: Secondary | ICD-10-CM

## 2024-01-30 DIAGNOSIS — K5190 Ulcerative colitis, unspecified, without complications: Secondary | ICD-10-CM

## 2024-01-31 ENCOUNTER — Ambulatory Visit: Payer: Self-pay | Admitting: Obstetrics

## 2024-01-31 LAB — LIPID PANEL
Chol/HDL Ratio: 2.7 ratio (ref 0.0–4.4)
Cholesterol, Total: 139 mg/dL (ref 100–199)
HDL: 52 mg/dL (ref >39)
LDL Chol Calc (NIH): 75 mg/dL (ref 0–99)
Triglycerides: 57 mg/dL (ref 0–149)
VLDL Cholesterol Cal: 12 mg/dL (ref 5–40)

## 2024-01-31 LAB — HEMOGLOBIN A1C
Est. average glucose Bld gHb Est-mCnc: 103 mg/dL
Hgb A1c MFr Bld: 5.2 % (ref 4.8–5.6)

## 2024-01-31 LAB — COMPREHENSIVE METABOLIC PANEL WITH GFR
ALT: 14 IU/L (ref 0–32)
AST: 20 IU/L (ref 0–40)
Albumin: 4.5 g/dL (ref 3.9–4.9)
Alkaline Phosphatase: 61 IU/L (ref 44–121)
BUN/Creatinine Ratio: 12 (ref 9–23)
BUN: 10 mg/dL (ref 6–24)
Bilirubin Total: 0.4 mg/dL (ref 0.0–1.2)
CO2: 26 mmol/L (ref 20–29)
Calcium: 9.7 mg/dL (ref 8.7–10.2)
Chloride: 101 mmol/L (ref 96–106)
Creatinine, Ser: 0.85 mg/dL (ref 0.57–1.00)
Globulin, Total: 2.5 g/dL (ref 1.5–4.5)
Glucose: 54 mg/dL — ABNORMAL LOW (ref 70–99)
Potassium: 3.8 mmol/L (ref 3.5–5.2)
Sodium: 139 mmol/L (ref 134–144)
Total Protein: 7 g/dL (ref 6.0–8.5)
eGFR: 87 mL/min/1.73 (ref >59)

## 2024-01-31 LAB — VITAMIN D 25 HYDROXY (VIT D DEFICIENCY, FRACTURES): Vit D, 25-Hydroxy: 29.6 ng/mL — ABNORMAL LOW (ref 30.0–100.0)

## 2024-01-31 LAB — TSH RFX ON ABNORMAL TO FREE T4: TSH: 1.15 u[IU]/mL (ref 0.450–4.500)

## 2024-03-03 ENCOUNTER — Ambulatory Visit (INDEPENDENT_AMBULATORY_CARE_PROVIDER_SITE_OTHER)

## 2024-03-03 ENCOUNTER — Other Ambulatory Visit: Payer: Self-pay

## 2024-03-03 DIAGNOSIS — L810 Postinflammatory hyperpigmentation: Secondary | ICD-10-CM | POA: Diagnosis not present

## 2024-03-03 DIAGNOSIS — D1801 Hemangioma of skin and subcutaneous tissue: Secondary | ICD-10-CM | POA: Diagnosis not present

## 2024-03-03 DIAGNOSIS — L438 Other lichen planus: Secondary | ICD-10-CM | POA: Diagnosis not present

## 2024-03-03 DIAGNOSIS — L578 Other skin changes due to chronic exposure to nonionizing radiation: Secondary | ICD-10-CM

## 2024-03-03 DIAGNOSIS — D229 Melanocytic nevi, unspecified: Secondary | ICD-10-CM

## 2024-03-03 DIAGNOSIS — L814 Other melanin hyperpigmentation: Secondary | ICD-10-CM | POA: Diagnosis not present

## 2024-03-03 DIAGNOSIS — L821 Other seborrheic keratosis: Secondary | ICD-10-CM | POA: Diagnosis not present

## 2024-03-03 DIAGNOSIS — W908XXA Exposure to other nonionizing radiation, initial encounter: Secondary | ICD-10-CM | POA: Diagnosis not present

## 2024-03-03 DIAGNOSIS — Z1283 Encounter for screening for malignant neoplasm of skin: Secondary | ICD-10-CM | POA: Diagnosis not present

## 2024-03-03 DIAGNOSIS — L439 Lichen planus, unspecified: Secondary | ICD-10-CM

## 2024-03-03 MED ORDER — CLOBETASOL PROPIONATE 0.05 % EX GEL
CUTANEOUS | 5 refills | Status: AC
  Filled 2024-03-03: qty 30, 15d supply, fill #0

## 2024-03-03 MED ORDER — CLOBETASOL PROPIONATE 0.05 % EX OINT
TOPICAL_OINTMENT | CUTANEOUS | 5 refills | Status: AC
  Filled 2024-03-03: qty 60, 30d supply, fill #0

## 2024-03-03 NOTE — Patient Instructions (Signed)

## 2024-03-03 NOTE — Progress Notes (Signed)
 Subjective   Lindsay Owens is a 43 y.o. female who presents for the following: Total body skin exam for skin cancer screening and mole check. The patient has spots, moles and lesions to be evaluated, some may be new or changing and the patient may have concern these could be cancer.. Patient is new patient  Today patient reports: History of lichen planus of the chest, back, arms, biopsy proven in the past. No areas noticed in the vaginal area. Gums flare in mouth with spicy foods. Not using any prescription topicals.   Review of Systems:    No other skin or systemic complaints except as noted in HPI or Assessment and Plan.  The following portions of the chart were reviewed this encounter and updated as appropriate: medications, allergies, medical history  Relevant Medical History:  Family history of skin cancer - mother with BCC   Objective  Well appearing patient in no apparent distress; mood and affect are within normal limits. Examination was performed of the: Full Skin Examination: scalp, head, eyes, ears, nose, lips, neck, chest, axillae, abdomen, back, buttocks, bilateral upper extremities, bilateral lower extremities, hands, feet, fingers, toes, fingernails, and toenails.   Examination notable for: Angioma(s): Scattered red vascular papule(s)  , Lentigo/lentigines: Scattered pigmented macules that are tan to brown in color and are somewhat non-uniform in shape and concentrated in the sun-exposed areas, Nevus/nevi: Scattered well-demarcated, regular, pigmented macule(s) and/or papule(s)  , Seborrheic Keratosis(es): Stuck-on appearing keratotic papule(s) on the trunk, none  irritated with redness, crusting, edema, and/or partial avulsion, Actinic Damage/Elastosis: chronic sun damage: dyspigmentation, telangiectasia, and wrinkling  Hypopigmented macules and patches on arms and trunk  Erythema and wickham striae of gums - most prominent on L upper gingiva  Examination limited by:  Undergarments     Assessment & Plan   SKIN CANCER SCREENING PERFORMED TODAY.  BENIGN SKIN FINDINGS  - Lentigines  - Seborrheic keratoses  - Hemangiomas   - Nevus/Multiple Benign Nevi - Reassurance provided regarding the benign appearance of lesions noted on exam today; no treatment is indicated in the absence of symptoms/changes. - Reinforced importance of photoprotective strategies including liberal and frequent sunscreen use of a broad-spectrum SPF 30 or greater, use of protective clothing, and sun avoidance for prevention of cutaneous malignancy and photoaging.  Counseled patient on the importance of regular self-skin monitoring as well as routine clinical skin examinations as scheduled.   ACTINIC DAMAGE - Chronic condition, secondary to cumulative UV/sun exposure - Recommend daily broad spectrum sunscreen SPF 30+ to sun-exposed areas, reapply every 2 hours as needed.  - Staying in the shade or wearing long sleeves, sun glasses (UVA+UVB protection) and wide brim hats (4-inch brim around the entire circumference of the hat) are also recommended for sun protection.  - Call for new or changing lesions.  Cutaneous lichen planus - today with only post inflammatory hypopigmentation  Oral lichen planus - flaring Chronic and persistent condition with duration or expected duration over one year. Condition is symptomatic and bothersome to patient. Patient is flaring and not currently at treatment goal.  - Discussed diagnosis, typical course, and treatment options for this condition - - Start clobetasol  ointment 0.05% twice daily to affected skin Discussed side effect of super potent topical steroids including atrophy, dyspigmentation, striae, telangectasia, folliculitis, loss of skin pigment, hair growth, tachyphylaxis, risk of systemic absorption with missuse. - Start clobetasol  ointment or gel 0.05% twice daily to affected mucosa for flares  - Discussed risk of oral SCC in  patients with oral  LP - advised at least yearly oral exam and to call with any new or changing lesions  - Discussed alternate treatments including immunosuppressants, acitretin, metronidazole, plaquenil- defers for now - has hx of IBD   Procedures, orders, diagnosis for this visit:  LICHEN PLANUS   Related Medications clobetasol  ointment (TEMOVATE ) 0.05 % Apply 1 gram topically to affected area of skin twice daily. Stop once resolved and restart as needed for flares. Avoid use on face, armpits, groin unless otherwise indicated.  Lichen planus -     Clobetasol  Propionate; Apply 1 gram topically to affected area of skin twice daily. Stop once resolved and restart as needed for flares. Avoid use on face, armpits, groin unless otherwise indicated.  Dispense: 60 g; Refill: 5  Other orders -     Clobetasol  Propionate; Apply 1 gram topically to affected area of skin twice daily. Stop once resolved and restart as needed for flares. Avoid use on face, armpits, groin unless otherwise indicated.  Dispense: 30 g; Refill: 5    Return to clinic: Return in about 1 year (around 03/03/2025) for TBSE.  Documentation: I have reviewed the above documentation for accuracy and completeness, and I agree with the above.  Lauraine JAYSON Kanaris, MD

## 2024-03-04 ENCOUNTER — Other Ambulatory Visit: Payer: Self-pay

## 2024-05-21 ENCOUNTER — Other Ambulatory Visit: Payer: Self-pay | Admitting: Obstetrics

## 2024-05-21 DIAGNOSIS — Z1231 Encounter for screening mammogram for malignant neoplasm of breast: Secondary | ICD-10-CM

## 2024-05-26 ENCOUNTER — Other Ambulatory Visit: Payer: Self-pay

## 2024-05-26 DIAGNOSIS — K5100 Ulcerative (chronic) pancolitis without complications: Secondary | ICD-10-CM | POA: Diagnosis not present

## 2024-05-26 MED ORDER — NA SULFATE-K SULFATE-MG SULF 17.5-3.13-1.6 GM/177ML PO SOLN
ORAL | 0 refills | Status: AC
  Filled 2024-05-26: qty 354, 1d supply, fill #0

## 2024-06-02 ENCOUNTER — Ambulatory Visit
Admission: RE | Admit: 2024-06-02 | Discharge: 2024-06-02 | Disposition: A | Discharge status: Home / Self Care | Attending: Gastroenterology | Admitting: Gastroenterology

## 2024-06-02 ENCOUNTER — Ambulatory Visit: Admitting: Anesthesiology

## 2024-06-02 ENCOUNTER — Encounter: Admission: RE | Disposition: A | Payer: Self-pay | Source: Home / Self Care | Attending: Gastroenterology

## 2024-06-02 ENCOUNTER — Other Ambulatory Visit: Payer: Self-pay

## 2024-06-02 ENCOUNTER — Encounter: Payer: Self-pay | Admitting: Gastroenterology

## 2024-06-02 DIAGNOSIS — Z79899 Other long term (current) drug therapy: Secondary | ICD-10-CM | POA: Diagnosis not present

## 2024-06-02 DIAGNOSIS — K5100 Ulcerative (chronic) pancolitis without complications: Secondary | ICD-10-CM | POA: Diagnosis present

## 2024-06-02 HISTORY — PX: COLONOSCOPY: SHX5424

## 2024-06-02 HISTORY — PX: BIOPSY OF SKIN SUBCUTANEOUS TISSUE AND/OR MUCOUS MEMBRANE: SHX6741

## 2024-06-02 LAB — POCT PREGNANCY, URINE: Preg Test, Ur: NEGATIVE

## 2024-06-02 SURGERY — COLONOSCOPY
Anesthesia: General

## 2024-06-02 MED ORDER — PROPOFOL 10 MG/ML IV BOLUS
INTRAVENOUS | Status: AC
  Filled 2024-06-02: qty 20

## 2024-06-02 MED ORDER — PROPOFOL 10 MG/ML IV BOLUS
INTRAVENOUS | Status: DC | PRN
  Administered 2024-06-02: 100 mg via INTRAVENOUS

## 2024-06-02 MED ORDER — PROPOFOL 500 MG/50ML IV EMUL
INTRAVENOUS | Status: DC | PRN
  Administered 2024-06-02: 150 ug/kg/min via INTRAVENOUS

## 2024-06-02 MED ORDER — SODIUM CHLORIDE 0.9 % IV SOLN: INTRAVENOUS | Status: DC

## 2024-06-02 MED ORDER — LIDOCAINE HCL (CARDIAC) PF 100 MG/5ML IV SOSY
PREFILLED_SYRINGE | INTRAVENOUS | Status: DC | PRN
  Administered 2024-06-02: 30 mg via INTRAVENOUS

## 2024-06-02 MED ORDER — LIDOCAINE HCL (PF) 2 % IJ SOLN
INTRAMUSCULAR | Status: AC
  Filled 2024-06-02: qty 5

## 2024-06-02 NOTE — Transfer of Care (Signed)
 Immediate Anesthesia Transfer of Care Note  Patient: Lindsay Owens  Procedure(s) Performed: COLONOSCOPY  Patient Location: PACU and Endoscopy Unit  Anesthesia Type:MAC  Level of Consciousness: sedated, patient cooperative, and responds to stimulation  Airway & Oxygen Therapy: Patient Spontanous Breathing and Patient connected to nasal cannula oxygen  Post-op Assessment: Report given to RN, Post -op Vital signs reviewed and stable, and Patient moving all extremities  Post vital signs: Reviewed and stable  Last Vitals:  Vitals Value Taken Time  BP 89/60 06/02/24 08:45  Temp 35.9 C 06/02/24 08:45  Pulse 59 06/02/24 08:45  Resp 17 06/02/24 08:45  SpO2 100 % 06/02/24 08:45    Last Pain:  Vitals:   06/02/24 0845  TempSrc: Temporal  PainSc: Asleep         Complications: No notable events documented.

## 2024-06-02 NOTE — H&P (Signed)
 "                                                                                                                           Ruel Kung , MD [address], [place], [place], [address] Phone: [phone] Fax: [phone]  Primary Care Physician:  Leigh Sober, MD   Pre-Procedure History & Physical: HPI:  Lindsay Owens is a 43 y.o. female is here for an colonoscopy.   Past Medical History:  Diagnosis Date   Acute mastitis of left breast    Miscarriage    Ulcerative colitis (HCC)    controlled    Past Surgical History:  Procedure Laterality Date   NO PAST SURGERIES      Prior to Admission medications  Medication Sig Start Date End Date Taking? Authorizing Provider  clobetasol  (TEMOVATE ) 0.05 % GEL Apply 1 gram topically to affected area of skin twice daily. Stop once resolved and restart as needed for flares. Avoid use on face, armpits, groin unless otherwise indicated. 03/03/24   Raymund Lauraine BROCKS, MD  clobetasol  ointment (TEMOVATE ) 0.05 % Apply 1 gram topically to affected area of skin twice daily. Stop once resolved and restart as needed for flares. Avoid use on face, armpits, groin unless otherwise indicated. 03/03/24   Raymund Lauraine BROCKS, MD  Na Sulfate-K Sulfate-Mg Sulfate concentrate (SUPREP) 17.5-3.13-1.6 GM/177ML SOLN Take 1 Bottle by mouth as directed One kit contains 2 bottles.  Take both bottles at the times instructed by your provider. Patient not taking: Reported on 06/02/2024 05/26/24       Allergies as of 05/28/2024   (No Known Allergies)    Family History  Problem Relation Age of Onset   Arthritis Mother    Prostate cancer Father    Heart disease Maternal Grandmother    Breast cancer Maternal Aunt    Colon cancer Neg Hx    Ovarian cancer Neg Hx    Diabetes Neg Hx     Social History   Socioeconomic History   Marital status: Married    Spouse name: [employer] Mining Engineer)   Number of children: Not on file   Years of education: Not on file   Highest  education level: Not on file  Occupational History   Not on file  Tobacco Use   Smoking status: Never    Passive exposure: Never   Smokeless tobacco: Never  Vaping Use   Vaping status: Never Used  Substance and Sexual Activity   Alcohol use: Not Currently    Comment: rare   Drug use: No   Sexual activity: Yes    Birth control/protection: None  Other Topics Concern   Not on file  Social History Narrative   Not on file   Social Drivers of Health   Tobacco Use: Low Risk (06/02/2024)   Patient History    Smoking Tobacco Use: Never    Smokeless Tobacco Use: Never    Passive Exposure: Never  Physicist, Medical  Strain: Not on file  Food Insecurity: No Food Insecurity (02/22/2022)   Hunger Vital Sign    Worried About Running Out of Food in the Last Year: Never true    Ran Out of Food in the Last Year: Never true  Transportation Needs: No Transportation Needs (02/22/2022)   PRAPARE - Administrator, Civil Service (Medical): No    Lack of Transportation (Non-Medical): No  Physical Activity: Not on file  Stress: Not on file  Social Connections: Not on file  Intimate Partner Violence: Not At Risk (02/22/2022)   Humiliation, Afraid, Rape, and Kick questionnaire    Fear of Current or Ex-Partner: No    Emotionally Abused: No    Physically Abused: No    Sexually Abused: No  Depression (PHQ2-9): Low Risk (01/30/2024)   Depression (PHQ2-9)    PHQ-2 Score: 0  Alcohol Screen: Not on file  Housing: Unknown (05/26/2024)   Received from [facility]   Epic    Unable to Pay for Housing in the Last Year: Not on file    Number of Times Moved in the Last Year: Not on file    At any time in the past 12 months, were you homeless or living in a shelter (including now)?: No  Utilities: Not At Risk (02/22/2022)   [facility] Utilities    Threatened with loss of utilities: No  Health Literacy: Not on file    Review of Systems: See HPI, otherwise negative ROS  Physical  Exam: BP 106/68   Pulse 61   Temp (!) 96.4 F (35.8 C) (Temporal)   Resp 16   Ht 5' 7 (1.702 m)   Wt 55 kg   SpO2 100%   BMI 18.98 kg/m  General:   Alert,  pleasant and cooperative in NAD Head:  Normocephalic and atraumatic. Neck:  Supple; no masses or thyromegaly. Lungs:  Clear throughout to auscultation, normal respiratory effort.    Heart:  +S1, +S2, Regular rate and rhythm, No edema. Abdomen:  Soft, nontender and nondistended. Normal bowel sounds, without guarding, and without rebound.   Neurologic:  Alert and  oriented x4;  grossly normal neurologically.  Impression/Plan: Lindsay Owens is here for an colonoscopy to be performed for chronic ulcerative colitis for dysplasia surveillance Risks, benefits, limitations, and alternatives regarding  colonoscopy have been reviewed with the patient.  Questions have been answered.  All parties agreeable.   Ruel Kung, MD  06/02/2024, 8:15 AM  "

## 2024-06-02 NOTE — Anesthesia Preprocedure Evaluation (Addendum)
"                                    Anesthesia Evaluation  Patient identified by MRN, date of birth, ID band Patient awake    Reviewed: Allergy & Precautions, NPO status , Patient's Chart, lab work & pertinent test results  Airway Mallampati: II  TM Distance: >3 FB Neck ROM: Full    Dental  (+) Teeth Intact   Pulmonary neg pulmonary ROS   Pulmonary exam normal        Cardiovascular negative cardio ROS Normal cardiovascular exam Rhythm:Regular Rate:Normal     Neuro/Psych negative neurological ROS  negative psych ROS   GI/Hepatic negative GI ROS, Neg liver ROS, PUD,,,  Endo/Other  negative endocrine ROS    Renal/GU negative Renal ROS  negative genitourinary   Musculoskeletal negative musculoskeletal ROS (+)    Abdominal   Peds negative pediatric ROS (+)  Hematology negative hematology ROS (+)   Anesthesia Other Findings Past Medical History: No date: Acute mastitis of left breast No date: Miscarriage No date: Ulcerative colitis (HCC)     Comment:  controlled  Past Surgical History: No date: NO PAST SURGERIES  BMI    Body Mass Index: 18.98 kg/m      Reproductive/Obstetrics negative OB ROS                              Anesthesia Physical Anesthesia Plan  ASA: 1  Anesthesia Plan: General   Post-op Pain Management:    Induction: Intravenous  PONV Risk Score and Plan: Propofol  infusion and TIVA  Airway Management Planned: Natural Airway and Nasal Cannula  Additional Equipment:   Intra-op Plan:   Post-operative Plan:   Informed Consent: I have reviewed the patients History and Physical, chart, labs and discussed the procedure including the risks, benefits and alternatives for the proposed anesthesia with the patient or authorized representative who has indicated his/her understanding and acceptance.     Dental Advisory Given  Plan Discussed with: CRNA  Anesthesia Plan Comments:          Anesthesia Quick Evaluation  "

## 2024-06-02 NOTE — Anesthesia Postprocedure Evaluation (Signed)
"   Anesthesia Post Note  Patient: Lindsay Owens  Procedure(s) Performed: COLONOSCOPY  Patient location during evaluation: PACU Anesthesia Type: General Level of consciousness: awake and alert Pain management: satisfactory to patient Vital Signs Assessment: post-procedure vital signs reviewed and stable Respiratory status: spontaneous breathing Cardiovascular status: blood pressure returned to baseline Anesthetic complications: no   No notable events documented.   Last Vitals:  Vitals:   06/02/24 0855 06/02/24 0905  BP: 94/63   Pulse: (!) 52   Resp: (!) 24   Temp:    SpO2: 100% 100%    Last Pain:  Vitals:   06/02/24 0905  TempSrc:   PainSc: 0-No pain                 VAN STAVEREN,Yonael Tulloch      "

## 2024-06-02 NOTE — Op Note (Signed)
 [facility] Patient Name: Lindsay Owens Procedure Date: 06/02/2024 8:14 AM MRN: [id number] Account #: [id number] Date of Birth: 11-27-1980 Admit Type: Outpatient Age: 43 Room: [facility] ENDO ROOM 2 Gender: Female Note Status: Finalized Instrument Name: Colon Scope [id number] Procedure:             Colonoscopy Indications:           High risk colon cancer surveillance: Ulcerative                         pancolitis of 8 (or more) years duration Providers:             Ruel Kung MD, MD Referring MD:          No Local Md, MD (Referring MD) Medicines:             Monitored Anesthesia Care Complications:         No immediate complications. Procedure:             Pre-Anesthesia Assessment:                        - Prior to the procedure, a History and Physical was                         performed, and patient medications, allergies and                         sensitivities were reviewed. The patient's tolerance                         of previous anesthesia was reviewed.                        - The risks and benefits of the procedure and the                         sedation options and risks were discussed with the                         patient. All questions were answered and informed                         consent was obtained.                        - ASA Grade Assessment: II - A patient with mild                         systemic disease.                        After obtaining informed consent, the colonoscope was                         passed under direct vision. Throughout the procedure,                         the patient's blood pressure, pulse, and oxygen  saturations were monitored continuously. The                         Colonoscope was introduced through the anus and                         advanced to the the cecum, identified by the                         appendiceal orifice. The colonoscopy was performed                          with ease. The patient tolerated the procedure well.                         The quality of the bowel preparation was excellent.                         The ileocecal valve, appendiceal orifice, and rectum                         were photographed. Findings:      The perianal and digital rectal examinations were normal.      Normal mucosa was found in the entire colon. These biopsies were       obtained randomly (in a non-targeted manner) for evaluation of       inflammatory bowel disease.      The exam was otherwise without abnormality on direct and retroflexion       views. Impression:            - Normal mucosa in the entire examined colon.                        - The examination was otherwise normal on direct and                         retroflexion views.                        - No specimens collected. Recommendation:        - Discharge patient to home (with escort).                        - Resume previous diet.                        - Continue present medications.                        - Await pathology results.                        - Repeat colonoscopy in 2 years for surveillance. Procedure Code(s):     --- Professional ---                        [phone], Colonoscopy, flexible; diagnostic, including                         collection of specimen(s) by brushing or washing, when  performed (separate procedure) Diagnosis Code(s):     --- Professional ---                        K51.00, Ulcerative (chronic) pancolitis without                         complications CPT copyright 2022 American Medical Association. All rights reserved. The codes documented in this report are preliminary and upon coder review may  be revised to meet current compliance requirements. Ruel Kung, MD Ruel Kung MD, MD 06/02/2024 8:44:23 AM This report has been signed electronically. Number of Addenda: 0 Note Initiated On: 06/02/2024 8:14 AM Scope Withdrawal Time: 0  hours 7 minutes 36 seconds  Total Procedure Duration: 0 hours 12 minutes 49 seconds  Estimated Blood Loss:  Estimated blood loss: none.      [facility]

## 2024-06-03 LAB — SURGICAL PATHOLOGY

## 2024-06-08 ENCOUNTER — Ambulatory Visit: Payer: Self-pay | Admitting: Gastroenterology

## 2024-06-25 ENCOUNTER — Telehealth: Admitting: Family Medicine

## 2024-06-25 DIAGNOSIS — H1032 Unspecified acute conjunctivitis, left eye: Secondary | ICD-10-CM

## 2024-06-25 MED ORDER — NEOMYCIN-POLYMYXIN-HC 3.5-10000-1 OP SUSP: 3.0000 [drp] | Freq: Four times a day (QID) | OPHTHALMIC | 0 refills | Status: DC

## 2024-06-25 NOTE — Progress Notes (Signed)
 " Virtual Visit Consent   [name], you are scheduled for a virtual visit with a [facility] provider today. Just as with appointments in the office, your consent must be obtained to participate. Your consent will be active for this visit and any virtual visit you may have with one of our providers in the next 365 days. If you have a MyChart account, a copy of this consent can be sent to you electronically.  As this is a virtual visit, video technology does not allow for your provider to perform a traditional examination. This may limit your provider's ability to fully assess your condition. If your provider identifies any concerns that need to be evaluated in person or the need to arrange testing (such as labs, EKG, etc.), we will make arrangements to do so. Although advances in technology are sophisticated, we cannot ensure that it will always work on either your end or our end. If the connection with a video visit is poor, the visit may have to be switched to a telephone visit. With either a video or telephone visit, we are not always able to ensure that we have a secure connection.  By engaging in this virtual visit, you consent to the provision of healthcare and authorize for your insurance to be billed (if applicable) for the services provided during this visit. Depending on your insurance coverage, you may receive a charge related to this service.  I need to obtain your verbal consent now. Are you willing to proceed with your visit today? [name] has provided verbal consent on 06/25/2024 for a virtual visit (video or telephone). [name], FNP  Date: 06/25/2024 7:14 PM   Virtual Visit via Video Note   I, [name], connected with  Lindsay Owens  ([id number], 12/03/1980) on 06/25/2024 at  7:00 PM EST by a video-enabled telemedicine application and verified that I am speaking with the correct person using two identifiers.  Location: Patient: Virtual Visit Location Patient: Home Provider:  Virtual Visit Location Provider: Home Office   I discussed the limitations of evaluation and management by telemedicine and the availability of in person appointments. The patient expressed understanding and agreed to proceed.    History of Present Illness: Lindsay Owens is a 44 y.o. who identifies as a female who was assigned female at birth, and is being seen today for redness of left eye with drainage for 2 days.   HPI: HPI  Problems: There are no active problems to display for this patient.   Allergies: Allergies[1] Medications: Current Medications[2]  Observations/Objective: Patient is well-developed, well-nourished in no acute distress.  Resting comfortably  at home.  Head is normocephalic, atraumatic.  No labored breathing.  Speech is clear and coherent with logical content.  Patient is alert and oriented at baseline.    Assessment and Plan: 1. Acute bacterial conjunctivitis of left eye (Primary)  Ways to prevent transmission discussed, UC as needed.   Follow Up Instructions: I discussed the assessment and treatment plan with the patient. The patient was provided an opportunity to ask questions and all were answered. The patient agreed with the plan and demonstrated an understanding of the instructions.  A copy of instructions were sent to the patient via MyChart unless otherwise noted below.     The patient was advised to call back or seek an in-person evaluation if the symptoms worsen or if the condition fails to improve as anticipated.    [name], FNP     [1] Not on  File [2]  Current Outpatient Medications:    neomycin -polymyxin-hydrocortisone (CORTISPORIN ) 3.5-10000-1 ophthalmic suspension, Place 3 drops into the left eye 4 (four) times daily for 7 days., Disp: 7.5 mL, Rfl: 0  "

## 2024-06-25 NOTE — Patient Instructions (Signed)
 Bacterial Conjunctivitis, Adult Bacterial conjunctivitis is an infection of the clear membrane that covers the white part of the eye and the inner surface of the eyelid (conjunctiva). When the blood vessels in the conjunctiva become inflamed, the eye becomes red or pink. The eye often feels irritated or itchy. Bacterial conjunctivitis spreads easily from person to person (is contagious). It also spreads easily from one eye to the other eye. What are the causes? This condition is caused by bacteria. You may get the infection if you come into close contact with: A person who is infected with the bacteria. Items that are contaminated with the bacteria, such as a face towel, contact lens solution, or eye makeup. What increases the risk? You are more likely to develop this condition if: You are exposed to other people who have the infection. You wear contact lenses. You have a sinus infection. You have had a recent eye injury or surgery. You have a weak body defense system (immune system). You have a medical condition that causes dry eyes. What are the signs or symptoms? Symptoms of this condition include: Thick, yellowish discharge from the eye. This may turn into a crust on the eyelid overnight and cause your eyelids to stick together. Tearing or watery eyes. Itchy eyes. Burning feeling in your eyes. Eye redness. Swollen eyelids. Blurred vision. How is this diagnosed? This condition is diagnosed based on your symptoms and medical history. Your health care provider may also take a sample of discharge from your eye to find the cause of your infection. How is this treated? This condition may be treated with: Antibiotic eye drops or ointment to clear the infection more quickly and prevent the spread of infection to others. Antibiotic medicines taken by mouth (orally) to treat infections that do not respond to drops or ointments or that last longer than 10 days. Cool, wet cloths (cool  compresses) placed on the eyes. Artificial tears applied 2-6 times a day. Follow these instructions at home: Medicines Take or apply your antibiotic medicine as told by your health care provider. Do not stop using the antibiotic, even if your condition improves, unless directed by your health care provider. Take or apply over-the-counter and prescription medicines only as told by your health care provider. Be very careful to avoid touching the edge of your eyelid with the eye-drop bottle or the ointment tube when you apply medicines to the affected eye. This will keep you from spreading the infection to your other eye or to other people. Managing discomfort Gently wipe away any drainage from your eye with a warm, wet washcloth or a cotton ball. Apply a clean, cool compress to your eye for 10-20 minutes, 3-4 times a day. General instructions Do not wear contact lenses until the inflammation is gone and your health care provider says it is safe to wear them again. Ask your health care provider how to sterilize or replace your contact lenses before you use them again. Wear glasses until you can resume wearing contact lenses. Avoid wearing eye makeup until the inflammation is gone. Throw away any old eye cosmetics that may be contaminated. Change or wash your pillowcase every day. Do not share towels or washcloths. This may spread the infection. Wash your hands often with soap and water for at least 20 seconds and especially before touching your face or eyes. Use paper towels to dry your hands. Avoid touching or rubbing your eyes. Do not drive or use heavy machinery if your vision is blurred. Contact  a health care provider if: You have a fever. Your symptoms do not get better after 10 days. Get help right away if: You have a fever and your symptoms suddenly get worse. You have severe pain when you move your eye. You have facial pain, redness, or swelling. You have a sudden loss of  vision. Summary Bacterial conjunctivitis is an infection of the clear membrane that covers the white part of the eye and the inner surface of the eyelid (conjunctiva). Bacterial conjunctivitis spreads easily from eye to eye and from person to person (is contagious). Wash your hands often with soap and water for at least 20 seconds and especially before touching your face or eyes. Use paper towels to dry your hands. Take or apply your antibiotic medicine as told by your health care provider. Do not stop using the antibiotic even if your condition improves. Contact a health care provider if you have a fever or if your symptoms do not get better after 10 days. Get help right away if you have a sudden loss of vision. This information is not intended to replace advice given to you by your health care provider. Make sure you discuss any questions you have with your health care provider. Document Revised: 09/08/2020 Document Reviewed: 09/08/2020 Elsevier Patient Education  2024 [employer].

## 2024-06-26 ENCOUNTER — Encounter: Payer: Self-pay | Admitting: Gastroenterology

## 2024-06-26 ENCOUNTER — Other Ambulatory Visit: Payer: Self-pay

## 2024-06-26 MED ORDER — NEOMYCIN-POLYMYXIN-HC 3.5-10000-1 OP SUSP
3.0000 [drp] | Freq: Four times a day (QID) | OPHTHALMIC | 0 refills | Status: DC
  Filled 2024-06-26: qty 7.5, 13d supply, fill #0

## 2024-06-26 MED ORDER — NEOMYCIN-POLYMYXIN-DEXAMETH 3.5-10000-0.1 OP SUSP
1.0000 [drp] | Freq: Four times a day (QID) | OPHTHALMIC | 0 refills | Status: AC
  Filled 2024-06-26: qty 5, 25d supply, fill #0

## 2024-06-26 NOTE — Addendum Note (Signed)
 Addended by: VIVIENNE FORTUNATO HERO on: 06/26/2024 03:46 PM   Modules accepted: Orders

## 2024-06-26 NOTE — Addendum Note (Signed)
 Addended by: VIVIENNE FORTUNATO HERO on: 06/26/2024 03:32 PM   Modules accepted: Orders

## 2024-06-27 ENCOUNTER — Ambulatory Visit
Admission: RE | Admit: 2024-06-27 | Discharge: 2024-06-27 | Disposition: A | Discharge status: Home / Self Care | Source: Ambulatory Visit | Attending: Obstetrics | Admitting: Obstetrics

## 2024-06-27 DIAGNOSIS — Z1231 Encounter for screening mammogram for malignant neoplasm of breast: Secondary | ICD-10-CM | POA: Diagnosis present

## 2024-07-18 ENCOUNTER — Ambulatory Visit
Admission: EM | Admit: 2024-07-18 | Discharge: 2024-07-18 | Disposition: A | Discharge status: Home / Self Care | Source: Home / Self Care

## 2024-07-18 DIAGNOSIS — J029 Acute pharyngitis, unspecified: Secondary | ICD-10-CM

## 2024-07-18 LAB — POCT RAPID STREP A (OFFICE): Rapid Strep A Screen: NEGATIVE

## 2024-07-18 NOTE — ED Triage Notes (Signed)
 Here today for a fever and sore throat. Tylenol  helps, took today at 1:30pm.

## 2024-07-18 NOTE — ED Provider Notes (Signed)
 " [name]    CSN: [id number] Arrival date & time: 07/18/24  1518      History   Chief Complaint Chief Complaint  Patient presents with   Sore Throat    HPI Lindsay Owens is a 44 y.o. female.  Patient presents with sore throat and subjective fever today.  She took Tylenol  for her symptoms.  No cough, shortness of breath, vomiting, diarrhea.  She is concerned for possible strep.   The history is provided by the patient and medical records.    Past Medical History:  Diagnosis Date   Acute mastitis of left breast    Miscarriage    Ulcerative colitis (HCC)    controlled    Patient Active Problem List   Diagnosis Date Noted   Lichen planus 01/30/2024   History of multiple miscarriages 02/23/2022   Post-dates pregnancy 02/22/2022   Ulcerative colitis without complications (HCC) 09/02/2015    Past Surgical History:  Procedure Laterality Date   BIOPSY OF SKIN SUBCUTANEOUS TISSUE AND/OR MUCOUS MEMBRANE  06/02/2024   Procedure: BIOPSY, GI;  Surgeon: Therisa Bi, MD;  Location: [facility] ENDOSCOPY;  Service: Gastroenterology;;   COLONOSCOPY N/A 06/02/2024   Procedure: COLONOSCOPY;  Surgeon: Therisa Bi, MD;  Location: [facility] ENDOSCOPY;  Service: Gastroenterology;  Laterality: N/A;   NO PAST SURGERIES      OB History     Gravida  7   Para  5   Term  5   Preterm  0   AB  2   Living  5      SAB  2   IAB  0   Ectopic  0   Multiple      Live Births  5            Home Medications    Prior to Admission medications  Medication Sig Start Date End Date Taking? Authorizing Provider  clobetasol  (TEMOVATE ) 0.05 % GEL Apply 1 gram topically to affected area of skin twice daily. Stop once resolved and restart as needed for flares. Avoid use on face, armpits, groin unless otherwise indicated. 03/03/24   Raymund Lauraine BROCKS, MD  clobetasol  ointment (TEMOVATE ) 0.05 % Apply 1 gram topically to affected area of skin twice daily. Stop once resolved and restart as  needed for flares. Avoid use on face, armpits, groin unless otherwise indicated. 03/03/24   Raymund Lauraine BROCKS, MD  Na Sulfate-K Sulfate-Mg Sulfate concentrate (SUPREP) 17.5-3.13-1.6 GM/177ML SOLN Take 1 Bottle by mouth as directed One kit contains 2 bottles.  Take both bottles at the times instructed by your provider. Patient not taking: Reported on 06/02/2024 05/26/24     neomycin -polymyxin b-dexamethasone  (MAXITROL ) 3.5-10000-0.1 SUSP Place 1 drop into the left eye every 6 (six) hours for 5 days. Patient not taking: Reported on 07/18/2024 06/26/24 07/21/24  Vivienne Delon CHRISTELLA, PA-C    Family History Family History  Problem Relation Age of Onset   Arthritis Mother    Prostate cancer Father    Heart disease Maternal Grandmother    Breast cancer Maternal Aunt    Colon cancer Neg Hx    Ovarian cancer Neg Hx    Diabetes Neg Hx     Social History Social History[1]   Allergies   Patient has no known allergies.   Review of Systems Review of Systems  Constitutional:  Positive for fever. Negative for chills.  HENT:  Positive for sore throat. Negative for ear pain.   Respiratory:  Negative for cough and shortness of  breath.   Gastrointestinal:  Negative for diarrhea and vomiting.     Physical Exam Triage Vital Signs ED Triage Vitals  Encounter Vitals Group     BP 07/18/24 1604 111/70     Girls Systolic BP Percentile --      Girls Diastolic BP Percentile --      Boys Systolic BP Percentile --      Boys Diastolic BP Percentile --      Pulse Rate 07/18/24 1604 64     Resp 07/18/24 1604 16     Temp 07/18/24 1604 98.1 F (36.7 C)     Temp Source 07/18/24 1604 Oral     SpO2 07/18/24 1604 99 %     Weight 07/18/24 1609 122 lb (55.3 kg)     Height --      Head Circumference --      Peak Flow --      Pain Score 07/18/24 1608 6     Pain Loc --      Pain Education --      Exclude from Growth Chart --    No data found.  Updated Vital Signs BP 111/70 (BP Location: Left Arm)   Pulse  64   Temp 98.1 F (36.7 C) (Oral)   Resp 16   Wt 122 lb (55.3 kg)   LMP 07/14/2024   SpO2 99%   BMI 19.11 kg/m   Visual Acuity Right Eye Distance:   Left Eye Distance:   Bilateral Distance:    Right Eye Near:   Left Eye Near:    Bilateral Near:     Physical Exam Constitutional:      General: She is not in acute distress. HENT:     Right Ear: Tympanic membrane normal.     Left Ear: Tympanic membrane normal.     Nose: Nose normal.     Mouth/Throat:     Mouth: Mucous membranes are moist.     Pharynx: Posterior oropharyngeal erythema present.  Cardiovascular:     Rate and Rhythm: Normal rate and regular rhythm.     Heart sounds: Normal heart sounds.  Pulmonary:     Effort: Pulmonary effort is normal. No respiratory distress.     Breath sounds: Normal breath sounds.  Neurological:     Mental Status: She is alert.      UC Treatments / Results  Labs (all labs ordered are listed, but only abnormal results are displayed) Labs Reviewed  POCT RAPID STREP A (OFFICE)    EKG   Radiology No results found.  Procedures Procedures (including critical care time)  Medications Ordered in UC Medications - No data to display  Initial Impression / Assessment and Plan / UC Course  I have reviewed the triage vital signs and the nursing notes.  Pertinent labs & imaging results that were available during my care of the patient were reviewed by me and considered in my medical decision making (see chart for details).    Viral pharyngitis.  Afebrile and vital signs are stable.  Rapid strep negative.  Discussed symptomatic treatment including Tylenol  or ibuprofen  as needed.  Education provided on sore throat.  Instructed patient to follow-up with her PCP if she is not improving.  She agrees to plan of care.  Final Clinical Impressions(s) / UC Diagnoses   Final diagnoses:  Viral pharyngitis     Discharge Instructions      Your strep test is negative.    Take Tylenol  or  ibuprofen  as needed  for fever or discomfort.    Follow-up with your primary care provider if your symptoms are not improving.      ED Prescriptions   None    PDMP not reviewed this encounter.    [1]  Social History Tobacco Use   Smoking status: Never    Passive exposure: Never   Smokeless tobacco: Never  Vaping Use   Vaping status: Never Used  Substance Use Topics   Alcohol use: Not Currently    Comment: rare   Drug use: No     Corlis Burnard DEL, NP 07/18/24 1634  "

## 2024-07-18 NOTE — Discharge Instructions (Addendum)
Your strep test is negative.    Take Tylenol or ibuprofen as needed for fever or discomfort.    Follow-up with your primary care provider if your symptoms are not improving.     

## 2025-03-03 ENCOUNTER — Ambulatory Visit: Admitting: Dermatology

## 2025-03-04 ENCOUNTER — Ambulatory Visit
